# Patient Record
Sex: Female | Born: 1975 | Race: Black or African American | Hispanic: No | Marital: Single | Smoking: Current some day smoker
Health system: Southern US, Community
[De-identification: ages and names within clinical notes are randomized; demographics above are authoritative.]

## PROBLEM LIST (undated history)

## (undated) HISTORY — PX: TUBAL LIGATION: SHX77

---

## 2000-09-05 ENCOUNTER — Emergency Department (HOSPITAL_COMMUNITY): Admission: EM | Admit: 2000-09-05 | Discharge: 2000-09-05 | Payer: Self-pay | Admitting: Emergency Medicine

## 2010-09-24 ENCOUNTER — Emergency Department (HOSPITAL_BASED_OUTPATIENT_CLINIC_OR_DEPARTMENT_OTHER): Admission: EM | Admit: 2010-09-24 | Discharge: 2010-09-24 | Payer: Self-pay | Admitting: Emergency Medicine

## 2011-03-10 ENCOUNTER — Emergency Department (HOSPITAL_BASED_OUTPATIENT_CLINIC_OR_DEPARTMENT_OTHER)
Admission: EM | Admit: 2011-03-10 | Discharge: 2011-03-11 | Disposition: A | Discharge status: Home / Self Care | Payer: Medicaid Other | Attending: Emergency Medicine | Admitting: Emergency Medicine

## 2011-03-10 DIAGNOSIS — F17200 Nicotine dependence, unspecified, uncomplicated: Secondary | ICD-10-CM | POA: Insufficient documentation

## 2011-03-10 DIAGNOSIS — R1013 Epigastric pain: Secondary | ICD-10-CM | POA: Insufficient documentation

## 2011-03-10 DIAGNOSIS — D649 Anemia, unspecified: Secondary | ICD-10-CM | POA: Insufficient documentation

## 2011-03-10 LAB — URINALYSIS, ROUTINE W REFLEX MICROSCOPIC
Glucose, UA: NEGATIVE mg/dL
Ketones, ur: NEGATIVE mg/dL
Nitrite: NEGATIVE
Protein, ur: NEGATIVE mg/dL
Urobilinogen, UA: 0.2 mg/dL (ref 0.0–1.0)

## 2011-03-10 LAB — COMPREHENSIVE METABOLIC PANEL
AST: 17 U/L (ref 0–37)
BUN: 12 mg/dL (ref 6–23)
CO2: 24 mEq/L (ref 19–32)
Calcium: 9.3 mg/dL (ref 8.4–10.5)
Chloride: 110 mEq/L (ref 96–112)
Creatinine, Ser: 0.7 mg/dL (ref 0.4–1.2)
GFR calc Af Amer: >60 mL/min (ref >60)
GFR calc non Af Amer: >60 mL/min (ref >60)
Glucose, Bld: 101 mg/dL — ABNORMAL HIGH (ref 70–99)
Total Bilirubin: 0.4 mg/dL (ref 0.3–1.2)

## 2011-03-10 LAB — PREGNANCY, URINE: Preg Test, Ur: NEGATIVE

## 2011-03-10 LAB — DIFFERENTIAL
Basophils Absolute: 0 10*3/uL (ref 0.0–0.1)
Basophils Relative: 0 % (ref 0–1)
Lymphocytes Relative: 31 % (ref 12–46)
Monocytes Absolute: 0.7 10*3/uL (ref 0.1–1.0)
Neutro Abs: 4.3 10*3/uL (ref 1.7–7.7)

## 2011-03-10 LAB — CBC
HCT: 33.1 % — ABNORMAL LOW (ref 36.0–46.0)
Hemoglobin: 11 g/dL — ABNORMAL LOW (ref 12.0–15.0)
MCHC: 33.2 g/dL (ref 30.0–36.0)
RDW: 14 % (ref 11.5–15.5)
WBC: 7.6 10*3/uL (ref 4.0–10.5)

## 2011-03-10 LAB — URINE MICROSCOPIC-ADD ON

## 2011-03-10 LAB — LIPASE, BLOOD: Lipase: 83 U/L (ref 23–300)

## 2011-05-13 ENCOUNTER — Emergency Department (HOSPITAL_BASED_OUTPATIENT_CLINIC_OR_DEPARTMENT_OTHER)
Admission: EM | Admit: 2011-05-13 | Discharge: 2011-05-13 | Disposition: A | Discharge status: Home / Self Care | Payer: Medicaid Other | Attending: Emergency Medicine | Admitting: Emergency Medicine

## 2011-05-13 DIAGNOSIS — N390 Urinary tract infection, site not specified: Secondary | ICD-10-CM | POA: Insufficient documentation

## 2011-05-13 DIAGNOSIS — A5901 Trichomonal vulvovaginitis: Secondary | ICD-10-CM | POA: Insufficient documentation

## 2011-05-13 DIAGNOSIS — F17200 Nicotine dependence, unspecified, uncomplicated: Secondary | ICD-10-CM | POA: Insufficient documentation

## 2011-05-13 LAB — URINALYSIS, ROUTINE W REFLEX MICROSCOPIC
Glucose, UA: NEGATIVE mg/dL
Ketones, ur: NEGATIVE mg/dL
Nitrite: NEGATIVE
Protein, ur: NEGATIVE mg/dL
pH: 5 (ref 5.0–8.0)

## 2011-05-13 LAB — PREGNANCY, URINE: Preg Test, Ur: NEGATIVE

## 2011-05-13 LAB — WET PREP, GENITAL

## 2011-05-13 LAB — URINE MICROSCOPIC-ADD ON

## 2011-05-14 LAB — GC/CHLAMYDIA PROBE AMP, GENITAL: Chlamydia, DNA Probe: NEGATIVE

## 2012-04-06 ENCOUNTER — Encounter (HOSPITAL_BASED_OUTPATIENT_CLINIC_OR_DEPARTMENT_OTHER): Payer: Self-pay | Admitting: *Deleted

## 2012-04-06 ENCOUNTER — Emergency Department (HOSPITAL_BASED_OUTPATIENT_CLINIC_OR_DEPARTMENT_OTHER)
Admission: EM | Admit: 2012-04-06 | Discharge: 2012-04-06 | Disposition: A | Discharge status: Home / Self Care | Payer: Self-pay | Attending: Emergency Medicine | Admitting: Emergency Medicine

## 2012-04-06 ENCOUNTER — Emergency Department (INDEPENDENT_AMBULATORY_CARE_PROVIDER_SITE_OTHER): Payer: Self-pay

## 2012-04-06 DIAGNOSIS — F17200 Nicotine dependence, unspecified, uncomplicated: Secondary | ICD-10-CM | POA: Insufficient documentation

## 2012-04-06 DIAGNOSIS — K5900 Constipation, unspecified: Secondary | ICD-10-CM | POA: Insufficient documentation

## 2012-04-06 DIAGNOSIS — R109 Unspecified abdominal pain: Secondary | ICD-10-CM

## 2012-04-06 DIAGNOSIS — K644 Residual hemorrhoidal skin tags: Secondary | ICD-10-CM | POA: Insufficient documentation

## 2012-04-06 MED ORDER — HYDROCORTISONE 2.5 % RE CREA: TOPICAL_CREAM | RECTAL | Status: AC

## 2012-04-06 MED ORDER — HYDROMORPHONE HCL PF 1 MG/ML IJ SOLN
1.0000 mg | Freq: Once | INTRAMUSCULAR | Status: AC
  Administered 2012-04-06: 1 mg via INTRAMUSCULAR
  Filled 2012-04-06: qty 1

## 2012-04-06 MED ORDER — FLEET ENEMA 7-19 GM/118ML RE ENEM
1.0000 | ENEMA | Freq: Once | RECTAL | Status: AC
  Administered 2012-04-06: 1 via RECTAL
  Filled 2012-04-06: qty 1

## 2012-04-06 MED ORDER — SENNOSIDES-DOCUSATE SODIUM 8.6-50 MG PO TABS: 1.0000 | ORAL_TABLET | Freq: Every day | ORAL | Status: AC

## 2012-04-06 MED ORDER — HYDROCODONE-ACETAMINOPHEN 5-500 MG PO TABS: 1.0000 | ORAL_TABLET | Freq: Four times a day (QID) | ORAL | Status: AC | PRN

## 2012-04-06 NOTE — ED Notes (Signed)
Pt back in room.  Was not able to have BM.  Sts the pain is gone but she feels the irritation of having stool in her rectum that needs to come out and she cannot get it out.

## 2012-04-06 NOTE — ED Notes (Signed)
Pt reports having external hemorrhoids. Sts she has had problems with constipation before but not this bad.

## 2012-04-06 NOTE — ED Notes (Signed)
MD at bedside. 

## 2012-04-06 NOTE — Discharge Instructions (Signed)
Constipation in Adults Constipation is having fewer than 2 bowel movements per week. Usually, the stools are hard. As we grow older, constipation is more common. If you try to fix constipation with laxatives, the problem may get worse. This is because laxatives taken over a long period of time make the colon muscles weaker. A low-fiber diet, not taking in enough fluids, and taking some medicines may make these problems worse. MEDICATIONS THAT MAY CAUSE CONSTIPATION  Water pills (diuretics).   Calcium channel blockers (used to control blood pressure and for the heart).   Certain pain medicines (narcotics).   Anticholinergics.   Anti-inflammatory agents.   Antacids that contain aluminum.  DISEASES THAT CONTRIBUTE TO CONSTIPATION  Diabetes.   Parkinson's disease.   Dementia.   Stroke.   Depression.   Illnesses that cause problems with salt and water metabolism.  HOME CARE INSTRUCTIONS   Constipation is usually best cared for without medicines. Increasing dietary fiber and eating more fruits and vegetables is the best way to manage constipation.   Slowly increase fiber intake to 25 to 38 grams per day. Whole grains, fruits, vegetables, and legumes are good sources of fiber. A dietitian can further help you incorporate high-fiber foods into your diet.   Drink enough water and fluids to keep your urine clear or pale yellow.   A fiber supplement may be added to your diet if you cannot get enough fiber from foods.   Increasing your activities also helps improve regularity.   Suppositories, as suggested by your caregiver, will also help. If you are using antacids, such as aluminum or calcium containing products, it will be helpful to switch to products containing magnesium if your caregiver says it is okay.   If you have been given a liquid injection (enema) today, this is only a temporary measure. It should not be relied on for treatment of longstanding (chronic) constipation.    Stronger measures, such as magnesium sulfate, should be avoided if possible. This may cause uncontrollable diarrhea. Using magnesium sulfate may not allow you time to make it to the bathroom.  SEEK IMMEDIATE MEDICAL CARE IF:   There is bright red blood in the stool.   The constipation stays for more than 4 days.   There is belly (abdominal) or rectal pain.   You do not seem to be getting better.   You have any questions or concerns.  MAKE SURE YOU:   Understand these instructions.   Will watch your condition.   Will get help right away if you are not doing well or get worse.  Document Released: 08/29/2004 Document Revised: 11/20/2011 Document Reviewed: 11/04/2011 [facility] Patient Information 2012 [place], [place].Hemorrhoids Hemorrhoids are enlarged (dilated) veins around the rectum. There are 2 types of hemorrhoids, and the type of hemorrhoid is determined by its location. Internal hemorrhoids occur in the veins just inside the rectum.They are usually not painful, but they may bleed.However, they may poke through to the outside and become irritated and painful. External hemorrhoids involve the veins outside the anus and can be felt as a painful swelling or hard lump near the anus.They are often itchy and may crack and bleed. Sometimes clots will form in the veins. This makes them swollen and painful. These are called thrombosed hemorrhoids. CAUSES Causes of hemorrhoids include:  Pregnancy. This increases the pressure in the hemorrhoidal veins.   Constipation.   Straining to have a bowel movement.   Obesity.   Heavy lifting or other activity that caused you to  strain.  TREATMENT Most of the time hemorrhoids improve in 1 to 2 weeks. However, if symptoms do not seem to be getting better or if you have a lot of rectal bleeding, your caregiver may perform a procedure to help make the hemorrhoids get smaller or remove them completely.Possible treatments include:  Rubber band  ligation. A rubber band is placed at the base of the hemorrhoid to cut off the circulation.   Sclerotherapy. A chemical is injected to shrink the hemorrhoid.   Infrared light therapy. Tools are used to burn the hemorrhoid.   Hemorrhoidectomy. This is surgical removal of the hemorrhoid.  HOME CARE INSTRUCTIONS   Increase fiber in your diet. Ask your caregiver about using fiber supplements.   Drink enough water and fluids to keep your urine clear or pale yellow.   Exercise regularly.   Go to the bathroom when you have the urge to have a bowel movement. Do not wait.   Avoid straining to have bowel movements.   Keep the anal area dry and clean.   Only take over-the-counter or prescription medicines for pain, discomfort, or fever as directed by your caregiver.  If your hemorrhoids are thrombosed:  Take warm sitz baths for 20 to 30 minutes, 3 to 4 times per day.   If the hemorrhoids are very tender and swollen, place ice packs on the area as tolerated. Using ice packs between sitz baths may be helpful. Fill a plastic bag with ice. Place a towel between the bag of ice and your skin.   Medicated creams and suppositories may be used or applied as directed.   Do not use a donut-shaped pillow or sit on the toilet for long periods. This increases blood pooling and pain.  SEEK MEDICAL CARE IF:   You have increasing pain and swelling that is not controlled with your medicine.   You have uncontrolled bleeding.   You have difficulty or you are unable to have a bowel movement.   You have pain or inflammation outside the area of the hemorrhoids.   You have chills or an oral temperature above 102 F (38.9 C).  MAKE SURE YOU:   Understand these instructions.   Will watch your condition.   Will get help right away if you are not doing well or get worse.  Document Released: 11/28/2000 Document Revised: 11/20/2011 Document Reviewed: 04/04/2008 [facility] Patient Information 2012 [place],  [place].Hemorrhoids Hemorrhoids are enlarged (dilated) veins around the rectum. There are 2 types of hemorrhoids, and the type of hemorrhoid is determined by its location. Internal hemorrhoids occur in the veins just inside the rectum.They are usually not painful, but they may bleed.However, they may poke through to the outside and become irritated and painful. External hemorrhoids involve the veins outside the anus and can be felt as a painful swelling or hard lump near the anus.They are often itchy and may crack and bleed. Sometimes clots will form in the veins. This makes them swollen and painful. These are called thrombosed hemorrhoids. CAUSES Causes of hemorrhoids include:  Pregnancy. This increases the pressure in the hemorrhoidal veins.   Constipation.   Straining to have a bowel movement.   Obesity.   Heavy lifting or other activity that caused you to strain.  TREATMENT Most of the time hemorrhoids improve in 1 to 2 weeks. However, if symptoms do not seem to be getting better or if you have a lot of rectal bleeding, your caregiver may perform a procedure to help make the hemorrhoids get  smaller or remove them completely.Possible treatments include:  Rubber band ligation. A rubber band is placed at the base of the hemorrhoid to cut off the circulation.   Sclerotherapy. A chemical is injected to shrink the hemorrhoid.   Infrared light therapy. Tools are used to burn the hemorrhoid.   Hemorrhoidectomy. This is surgical removal of the hemorrhoid.  HOME CARE INSTRUCTIONS   Increase fiber in your diet. Ask your caregiver about using fiber supplements.   Drink enough water and fluids to keep your urine clear or pale yellow.   Exercise regularly.   Go to the bathroom when you have the urge to have a bowel movement. Do not wait.   Avoid straining to have bowel movements.   Keep the anal area dry and clean.   Only take over-the-counter or prescription medicines for pain,  discomfort, or fever as directed by your caregiver.  If your hemorrhoids are thrombosed:  Take warm sitz baths for 20 to 30 minutes, 3 to 4 times per day.   If the hemorrhoids are very tender and swollen, place ice packs on the area as tolerated. Using ice packs between sitz baths may be helpful. Fill a plastic bag with ice. Place a towel between the bag of ice and your skin.   Medicated creams and suppositories may be used or applied as directed.   Do not use a donut-shaped pillow or sit on the toilet for long periods. This increases blood pooling and pain.  SEEK MEDICAL CARE IF:   You have increasing pain and swelling that is not controlled with your medicine.   You have uncontrolled bleeding.   You have difficulty or you are unable to have a bowel movement.   You have pain or inflammation outside the area of the hemorrhoids.   You have chills or an oral temperature above 102 F (38.9 C).  MAKE SURE YOU:   Understand these instructions.   Will watch your condition.   Will get help right away if you are not doing well or get worse.  Document Released: 11/28/2000 Document Revised: 11/20/2011 Document Reviewed: 04/04/2008 [facility] Patient Information 2012 [place], [place].

## 2012-04-06 NOTE — ED Notes (Signed)
Patient transported to X-ray 

## 2012-04-06 NOTE — ED Notes (Signed)
Pt c/o hemorrhoids pain x1 day

## 2012-04-07 NOTE — ED Provider Notes (Signed)
History     CSN: [id number]  Arrival date & time 04/06/12  1604   First MD Initiated Contact with Patient 04/06/12 1616      Chief Complaint  Patient presents with  . Hemorrhoids    (Consider location/radiation/quality/duration/timing/severity/associated sxs/prior treatment) HPI Patient is a 36 yo female with history of hemorrhoids who presents complaining of 10/10 rectal pain with 2 days of constipation.  She denies abdominal pain, nausea, vomiting, fevers, or history of obstruction.  She denies urinary symptoms but is in the midst of an HSV outbreak.  She denies any history of anorectal involvement and does not feel that her pain is similar to that.  At home patient tried enema as well as manual disimpaction without relief.  She has noted some blood but no significant rectal bleeding. There are no other associated or modifying factors.  History reviewed. No pertinent past medical history.  Past Surgical History  Procedure Date  . Tubal ligation     History reviewed. No pertinent family history.  History  Substance Use Topics  . Smoking status: Current Some Day Smoker  . Smokeless tobacco: Not on file  . Alcohol Use: No    OB History    Grav Para Term Preterm Abortions TAB SAB Ect Mult Living                  Review of Systems  Constitutional: Negative.   HENT: Negative.   Eyes: Negative.   Respiratory: Negative.   Cardiovascular: Negative.   Gastrointestinal: Positive for constipation, anal bleeding and rectal pain.  Genitourinary: Negative.   Musculoskeletal: Negative.   Skin: Negative.   Neurological: Negative.   Hematological: Negative.   Psychiatric/Behavioral: Negative.   All other systems reviewed and are negative.    Allergies  Review of patient's allergies indicates no known allergies.  Home Medications   Current Outpatient Rx  Name Route Sig Dispense Refill  . HYDROCODONE-ACETAMINOPHEN 5-500 MG PO TABS Oral Take 1-2 tablets by mouth every 6  (six) hours as needed for pain. 15 tablet 0  . HYDROCORTISONE 2.5 % RE CREA  Apply rectally 2 times daily 30 g 1  . SENNOSIDES-DOCUSATE SODIUM 8.6-50 MG PO TABS Oral Take 1 tablet by mouth daily. 30 tablet 0    BP 129/89  Pulse 87  Temp(Src) 98.2 F (36.8 C) (Oral)  Resp 16  Ht 5\' 4"  (1.626 m)  Wt 188 lb (85.276 kg)  BMI 32.27 kg/m2  SpO2 100%  LMP 04/04/2012  Physical Exam  Nursing note and vitals reviewed. GEN: Well-developed, well-nourished female in no distress, tearful and very uncomfortable HEENT: Atraumatic, normocephalic. Oropharynx clear without erythema EYES: PERRLA BL, no scleral icterus. NECK: Trachea midline, no meningismus CV: regular rate and rhythm. No murmurs, rubs, or gallops PULM: No respiratory distress.  No crackles, wheezes, or rales. GI: soft, non-tender. No guarding, rebound, or tenderness. + bowel sounds.  Rectal with 2 small external hemorrhoids, small internal hemorrhoids appreciated with no obvious abscesses or fluctuance felt, no gross blood on the glove, large amount of moderately soft stool in the rectal vault  GU: deferred Neuro: cranial nerves 2-12 intact, no abnormalities of strength or sensation, A and O x 3 MSK: Patient moves all 4 extremities symmetrically, no deformity, edema, or injury noted Skin: No rashes petechiae, purpura, or jaundice Psych: no abnormality of mood   ED Course  Procedures (including critical care time)  Labs Reviewed - No data to display Dg Abd Acute W/chest  04/06/2012  *RADIOLOGY  REPORT*  Clinical Data: Abdominal pain.  Constipation.  ACUTE ABDOMEN SERIES (ABDOMEN 2 VIEW & CHEST 1 VIEW)  Comparison:  None.  Findings:  There is no evidence of dilated bowel loops or free intraperitoneal air.  No radiopaque calculi or other significant radiographic abnormality is seen. Heart size and mediastinal contours are within normal limits.  Both lungs are clear.  IMPRESSION: Negative abdominal radiographs.  No acute cardiopulmonary  disease.  Original Report Authenticated By: Danae Orleans, M.D.     1. External hemorrhoids   2. Constipation       MDM  Patient was evaluated and given her pain one IM dose of pain medication was given and patient tried to defecate.  She was unable to do so and plain film was performed to rule out obstruction.  This was negative.  Patient agreed to enema here and was able to have a BM with near complete resolution of symptoms.  Patient was discharged with stool softener and anusol prescription.  She was given the number of GI for follow-up if needed in the future.  She was given 10 tabs of po pain medicine and cautioned about associated constipation.  Patient was discharged in good condition.        Cyndra Numbers, MD 04/07/12 [phone]

## 2012-05-31 IMAGING — CR DG ABDOMEN ACUTE W/ 1V CHEST
3 series · 3 of 3 positions shown · non-contrast
Comparison: None.

CLINICAL DATA: Abdominal pain.  Constipation.

ACUTE ABDOMEN SERIES (ABDOMEN 2 VIEW & CHEST 1 VIEW)

[w chest pa]
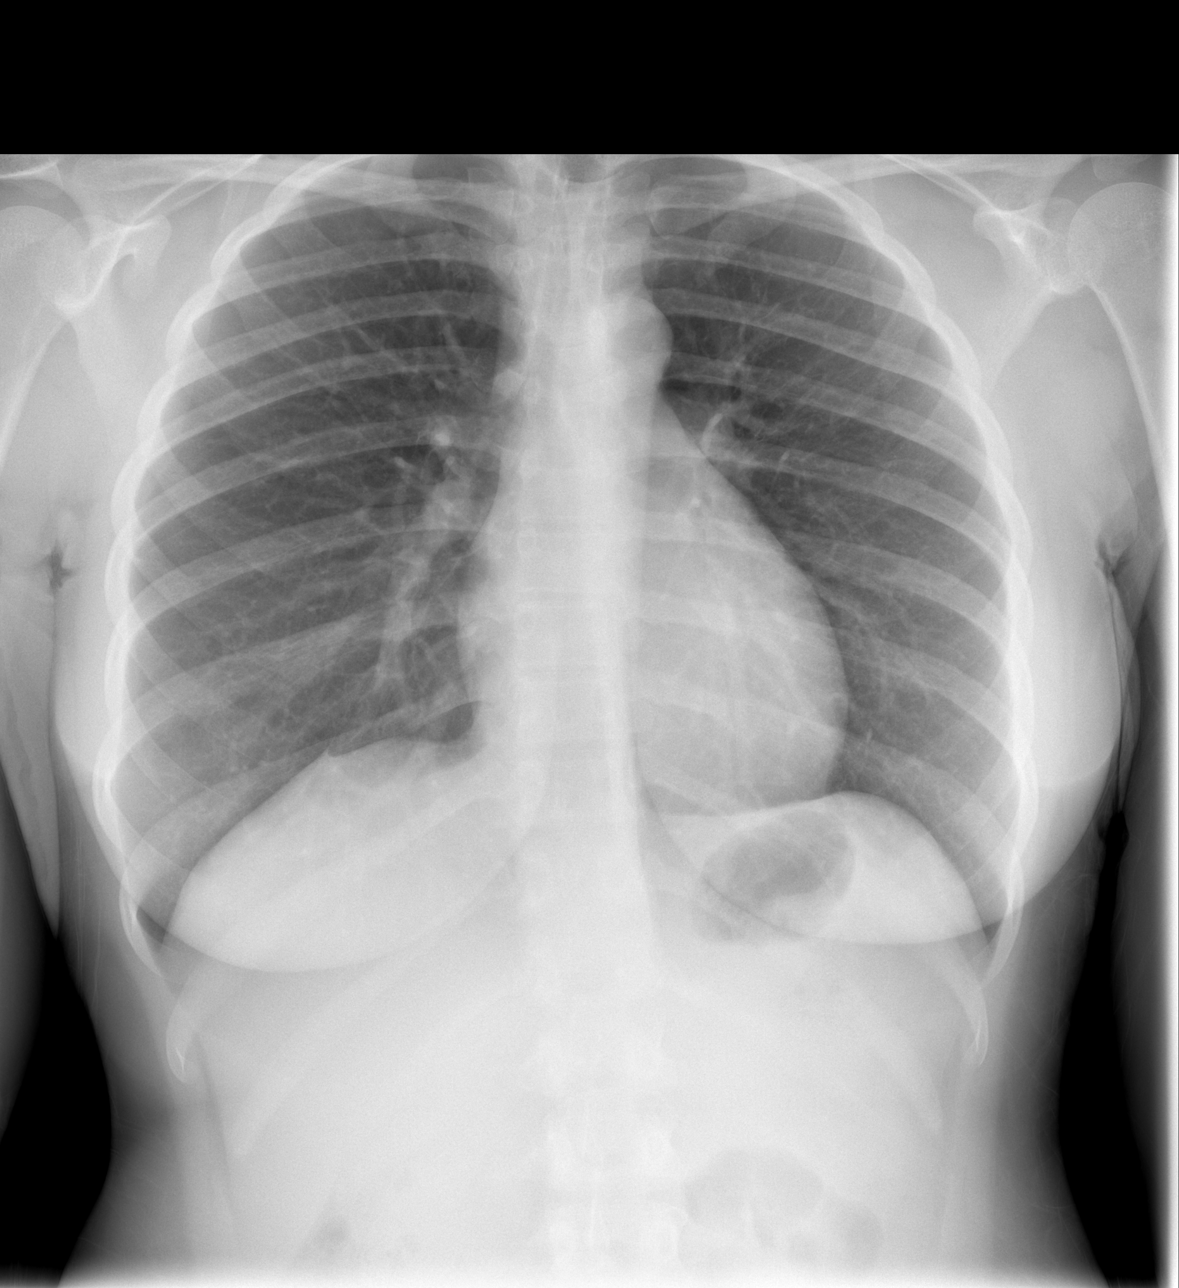

[w abdomen upright]
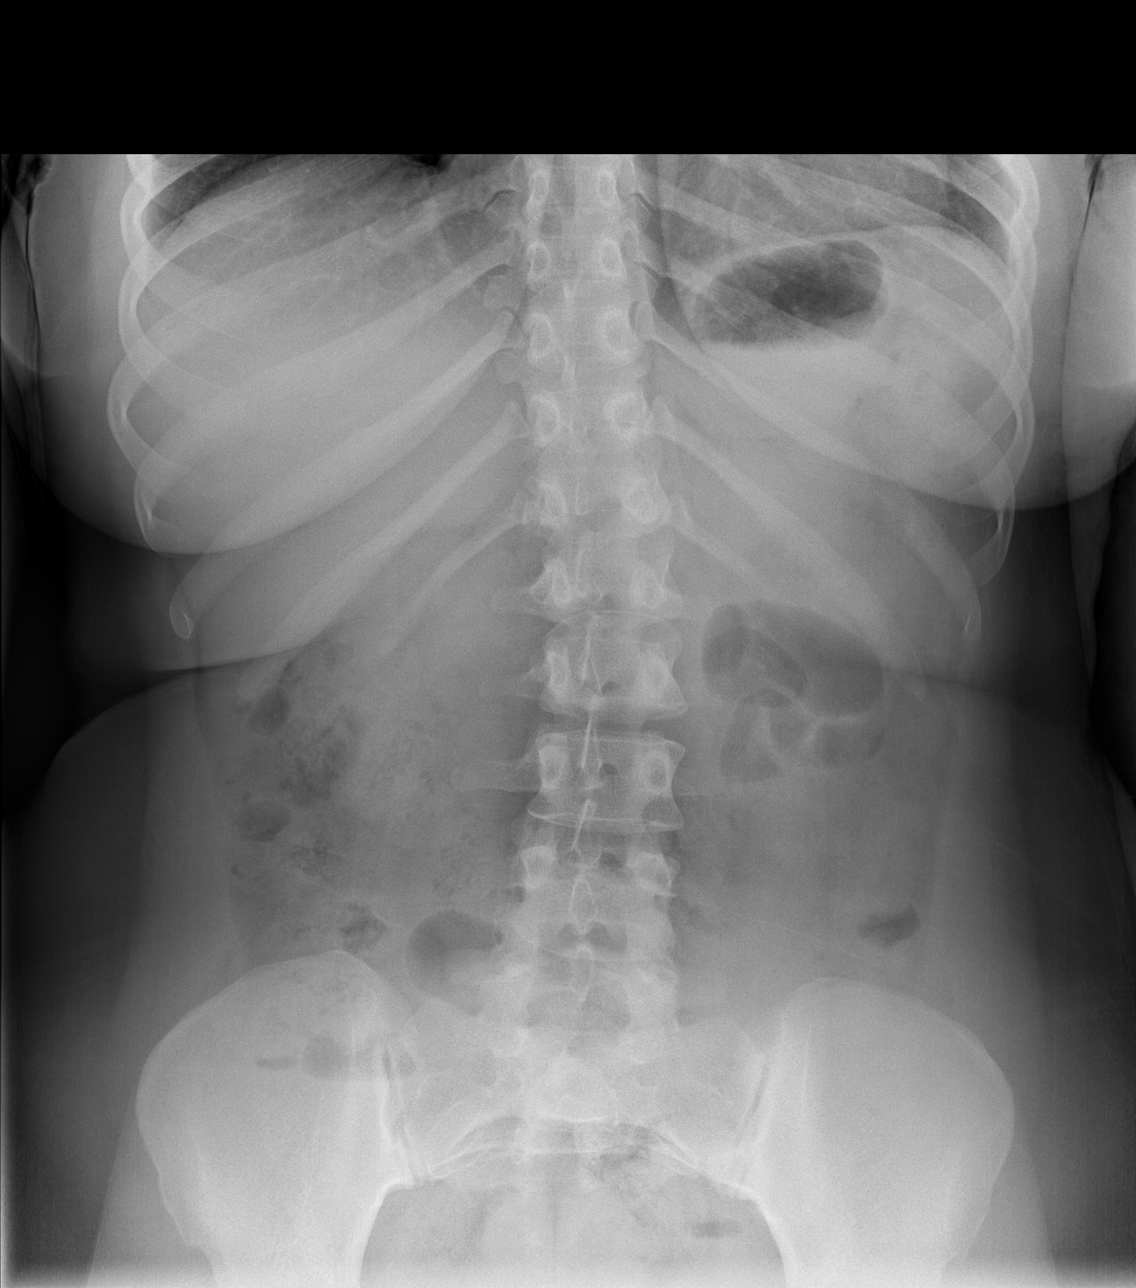

[t abdomen supine]
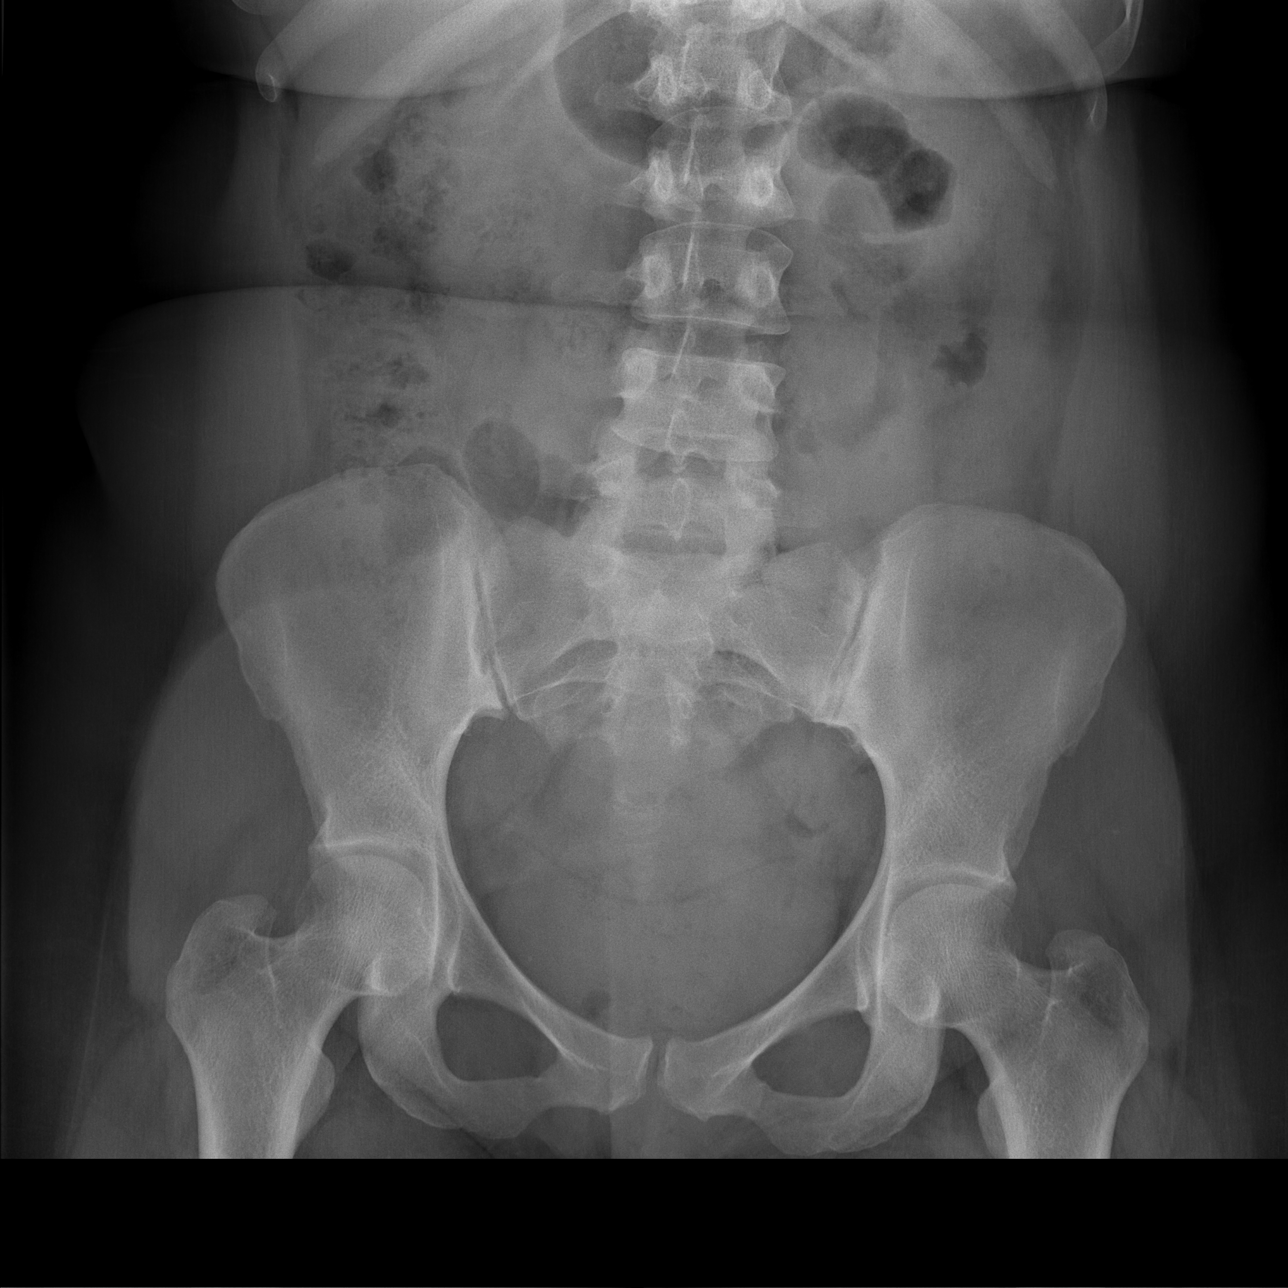

[3 of 3 positions shown; findings below may reference images not displayed]

FINDINGS: There is no evidence of dilated bowel loops or free
intraperitoneal air.  No radiopaque calculi or other significant
radiographic abnormality is seen. Heart size and mediastinal
contours are within normal limits.  Both lungs are clear.
IMPRESSION: Negative abdominal radiographs.  No acute cardiopulmonary disease.

## 2013-07-07 ENCOUNTER — Encounter (HOSPITAL_BASED_OUTPATIENT_CLINIC_OR_DEPARTMENT_OTHER): Payer: Self-pay | Admitting: *Deleted

## 2013-07-07 ENCOUNTER — Emergency Department (HOSPITAL_BASED_OUTPATIENT_CLINIC_OR_DEPARTMENT_OTHER)
Admission: EM | Admit: 2013-07-07 | Discharge: 2013-07-07 | Disposition: A | Discharge status: Home / Self Care | Payer: Self-pay | Attending: Emergency Medicine | Admitting: Emergency Medicine

## 2013-07-07 DIAGNOSIS — R350 Frequency of micturition: Secondary | ICD-10-CM | POA: Insufficient documentation

## 2013-07-07 DIAGNOSIS — F17200 Nicotine dependence, unspecified, uncomplicated: Secondary | ICD-10-CM | POA: Insufficient documentation

## 2013-07-07 DIAGNOSIS — N390 Urinary tract infection, site not specified: Secondary | ICD-10-CM | POA: Insufficient documentation

## 2013-07-07 LAB — URINALYSIS, ROUTINE W REFLEX MICROSCOPIC
Bilirubin Urine: NEGATIVE
Glucose, UA: NEGATIVE mg/dL
Hgb urine dipstick: NEGATIVE
Ketones, ur: NEGATIVE mg/dL
Nitrite: NEGATIVE
Protein, ur: NEGATIVE mg/dL
Specific Gravity, Urine: 1.023 (ref 1.005–1.030)
Urobilinogen, UA: 0.2 mg/dL (ref 0.0–1.0)
pH: 7 (ref 5.0–8.0)

## 2013-07-07 LAB — URINE MICROSCOPIC-ADD ON

## 2013-07-07 MED ORDER — SULFAMETHOXAZOLE-TMP DS 800-160 MG PO TABS
1.0000 | ORAL_TABLET | Freq: Once | ORAL | Status: AC
  Administered 2013-07-07: 1 via ORAL
  Filled 2013-07-07: qty 1

## 2013-07-07 MED ORDER — PHENAZOPYRIDINE HCL 200 MG PO TABS: 200.0000 mg | ORAL_TABLET | Freq: Three times a day (TID) | ORAL | Status: DC

## 2013-07-07 MED ORDER — SULFAMETHOXAZOLE-TRIMETHOPRIM 800-160 MG PO TABS: 1.0000 | ORAL_TABLET | Freq: Two times a day (BID) | ORAL | Status: AC

## 2013-07-07 NOTE — ED Provider Notes (Signed)
   History    CSN: [id number] Arrival date & time 07/07/13  6295  First MD Initiated Contact with Patient 07/07/13 [phone]     Chief Complaint  Patient presents with  . Dysuria  . Urinary Frequency   (Consider location/radiation/quality/duration/timing/severity/associated sxs/prior Treatment) HPI Patient presents with ongoing dysuria. Symptoms began approximately 3 weeks ago. Symptoms have been persistent in spite of using OTC medication, not antibiotics. There is no report of new fever, chills, nausea, vomiting, diarrhea. Last menstrual cycle was one week ago. Symptoms are described as pressure-like discomfort both during and after urination. No report of new hematuria Patient also complains of occasional bilateral knee pain.  History reviewed. No pertinent past medical history. Past Surgical History  Procedure Laterality Date  . Tubal ligation     History reviewed. No pertinent family history. History  Substance Use Topics  . Smoking status: Current Some Day Smoker  . Smokeless tobacco: Not on file  . Alcohol Use: No   OB History   Grav Para Term Preterm Abortions TAB SAB Ect Mult Living                 Review of Systems  All other systems reviewed and are negative.    Allergies  Review of patient's allergies indicates no known allergies.  Home Medications  No current outpatient prescriptions on file. BP 116/69  Pulse 74  Temp(Src) 99 F (37.2 C) (Oral)  Resp 18  Ht 5\' 4"  (1.626 m)  Wt 174 lb (78.926 kg)  BMI 29.85 kg/m2  SpO2 100%  LMP 06/28/2013 Physical Exam  Nursing note and vitals reviewed. Constitutional: She is oriented to person, place, and time. She appears well-developed and well-nourished. No distress.  HENT:  Head: Normocephalic and atraumatic.  Eyes: Conjunctivae and EOM are normal.  Cardiovascular: Normal rate and regular rhythm.   Pulmonary/Chest: Effort normal and breath sounds normal. No stridor. No respiratory distress.  Abdominal:  She exhibits no distension. There is no hepatosplenomegaly. There is tenderness in the suprapubic area. There is no rigidity, no rebound, no guarding and no CVA tenderness.  Musculoskeletal: She exhibits no edema.  Neurological: She is alert and oriented to person, place, and time. No cranial nerve deficit.  Skin: Skin is warm and dry.  Psychiatric: She has a normal mood and affect.    ED Course  Procedures (including critical care time) Labs Reviewed  URINALYSIS, ROUTINE W REFLEX MICROSCOPIC   No results found. No diagnosis found.  MDM  This well-appearing afebrile female presents with ongoing urinary complaints. On exam she is awake and alert in no distress.  There is mild tenderness to palpation in the suprapubic region, but no peritonitis or guarding. Patient's urinalysis demonstrates mild abnormalities.  With her description of symptoms, antibiotics were provided with urine culture sent.   Gerhard Munch, MD 07/07/13 [phone]

## 2013-07-07 NOTE — ED Notes (Signed)
Patient asked to provide clean catch urine sample. 

## 2013-07-07 NOTE — ED Notes (Signed)
Pt amb to room 10 with quick steady gait in nad. Pt reports dysuria "I've been feeling some kinda way down there after I pee..." frequency, urgency. Pt states she has been taking azo with relief, but now the azo is not helping. md at bedside for exam.

## 2013-07-08 LAB — URINE CULTURE: Colony Count: >=100000

## 2014-01-13 ENCOUNTER — Emergency Department (HOSPITAL_BASED_OUTPATIENT_CLINIC_OR_DEPARTMENT_OTHER)
Admission: EM | Admit: 2014-01-13 | Discharge: 2014-01-13 | Disposition: A | Discharge status: Home / Self Care | Payer: Medicaid Other | Attending: Emergency Medicine | Admitting: Emergency Medicine

## 2014-01-13 ENCOUNTER — Encounter (HOSPITAL_BASED_OUTPATIENT_CLINIC_OR_DEPARTMENT_OTHER): Payer: Self-pay | Admitting: Emergency Medicine

## 2014-01-13 DIAGNOSIS — Z9851 Tubal ligation status: Secondary | ICD-10-CM | POA: Insufficient documentation

## 2014-01-13 DIAGNOSIS — F17200 Nicotine dependence, unspecified, uncomplicated: Secondary | ICD-10-CM | POA: Insufficient documentation

## 2014-01-13 DIAGNOSIS — R102 Pelvic and perineal pain: Secondary | ICD-10-CM

## 2014-01-13 DIAGNOSIS — Z3202 Encounter for pregnancy test, result negative: Secondary | ICD-10-CM | POA: Insufficient documentation

## 2014-01-13 DIAGNOSIS — N83209 Unspecified ovarian cyst, unspecified side: Secondary | ICD-10-CM | POA: Insufficient documentation

## 2014-01-13 DIAGNOSIS — Z79899 Other long term (current) drug therapy: Secondary | ICD-10-CM | POA: Insufficient documentation

## 2014-01-13 LAB — URINALYSIS, ROUTINE W REFLEX MICROSCOPIC
BILIRUBIN URINE: NEGATIVE
Glucose, UA: NEGATIVE mg/dL
HGB URINE DIPSTICK: NEGATIVE
Ketones, ur: 15 mg/dL — AB
Leukocytes, UA: NEGATIVE
NITRITE: NEGATIVE
PH: 6 (ref 5.0–8.0)
Protein, ur: NEGATIVE mg/dL
SPECIFIC GRAVITY, URINE: 1.025 (ref 1.005–1.030)
Urobilinogen, UA: 0.2 mg/dL (ref 0.0–1.0)

## 2014-01-13 LAB — PREGNANCY, URINE: PREG TEST UR: NEGATIVE

## 2014-01-13 LAB — WET PREP, GENITAL
Trich, Wet Prep: NONE SEEN
YEAST WET PREP: NONE SEEN

## 2014-01-13 MED ORDER — ONDANSETRON 4 MG PO TBDP: 4.0000 mg | ORAL_TABLET | Freq: Three times a day (TID) | ORAL | Status: DC | PRN

## 2014-01-13 MED ORDER — HYDROCODONE-ACETAMINOPHEN 5-325 MG PO TABS
1.0000 | ORAL_TABLET | Freq: Once | ORAL | Status: AC
  Administered 2014-01-13: 1 via ORAL
  Filled 2014-01-13: qty 1

## 2014-01-13 MED ORDER — NAPROXEN 500 MG PO TABS: 500.0000 mg | ORAL_TABLET | Freq: Two times a day (BID) | ORAL | Status: DC

## 2014-01-13 MED ORDER — ONDANSETRON 4 MG PO TBDP
4.0000 mg | ORAL_TABLET | Freq: Once | ORAL | Status: AC
  Administered 2014-01-13: 4 mg via ORAL
  Filled 2014-01-13: qty 1

## 2014-01-13 MED ORDER — HYDROCODONE-ACETAMINOPHEN 5-325 MG PO TABS: 2.0000 | ORAL_TABLET | ORAL | Status: DC | PRN

## 2014-01-13 NOTE — Discharge Instructions (Signed)
Ovarian Cyst An ovarian cyst is a sac filled with fluid or blood. This sac is attached to the ovary. Some cysts go away on their own. Other cysts need treatment.  HOME CARE   Only take medicine as told by your doctor.  Follow up with your doctor as told.  Get regular pelvic exams and Pap tests. GET HELP IF:  Your periods are late, not regular, or painful.  You stop having periods.  Your belly (abdominal) or pelvic pain does not go away.  Your belly becomes large or puffy (swollen).  You have a hard time peeing (totally emptying your bladder).  You have pressure on your bladder.  You have pain during sex.  You feel fullness, pressure, or discomfort in your belly.  You lose weight for no reason.  You feel sick most of the time.  You have a hard time pooping (constipation).  You do not feel like eating.  You develop pimples (acne).  You have an increase in hair on your body and face.  You are gaining weight for no reason.  You think you are pregnant. GET HELP RIGHT AWAY IF:   Your belly pain gets worse.  You feel sick to your stomach (nauseous), and you throw up (vomit).  You have a fever that comes on fast.  You have belly pain while pooping (bowel movement).  Your periods are heavier than usual. MAKE SURE YOU:   Understand these instructions.  Will watch your condition.  Will get help right away if you are not doing well or get worse. Document Released: 05/19/2008 Document Revised: 09/21/2013 Document Reviewed: 08/08/2013 [facility] Patient Information 2014 [place].  Pelvic Pain, Female Female pelvic pain can be caused by many different things and start from a variety of places. Pelvic pain refers to pain that is located in the lower half of the abdomen and between your hips. The pain may occur over a short period of time (acute) or may be reoccurring (chronic). The cause of pelvic pain may be related to disorders affecting the female reproductive  organs (gynecologic), but it may also be related to the bladder, kidney stones, an intestinal complication, or muscle or skeletal problems. Getting help right away for pelvic pain is important, especially if there has been severe, sharp, or a sudden onset of unusual pain. It is also important to get help right away because some types of pelvic pain can be life threatening.  CAUSES  Below are only some of the causes of pelvic pain. The causes of pelvic pain can be in one of several categories.   Gynecologic.  Pelvic inflammatory disease.  Sexually transmitted infection.  Ovarian cyst or a twisted ovarian ligament (ovarian torsion).  Uterine lining that grows outside the uterus (endometriosis).  Fibroids, cysts, or tumors.  Ovulation.  Pregnancy.  Pregnancy that occurs outside the uterus (ectopic pregnancy).  Miscarriage.  Labor.  Abruption of the placenta or ruptured uterus.  Infection.  Uterine infection (endometritis).  Bladder infection.  Diverticulitis.  Miscarriage related to a uterine infection (septic abortion).  Bladder.  Inflammation of the bladder (cystitis).  Kidney stone(s).  Gastrointenstinal.  Constipation.  Diverticulitis.  Neurologic.  Trauma.  Feeling pelvic pain because of mental or emotional causes (psychosomatic).  Cancers of the bowel or pelvis. EVALUATION  Your caregiver will want to take a careful history of your concerns. This includes recent changes in your health, a careful gynecologic history of your periods (menses), and a sexual history. Obtaining your family history and  medical history is also important. Your caregiver may suggest a pelvic exam. A pelvic exam will help identify the location and severity of the pain. It also helps in the evaluation of which organ system may be involved. In order to identify the cause of the pelvic pain and be properly treated, your caregiver may order tests. These tests may include:   A pregnancy  test.  Pelvic ultrasonography.  An X-ray exam of the abdomen.  A urinalysis or evaluation of vaginal discharge.  Blood tests. HOME CARE INSTRUCTIONS   Only take over-the-counter or prescription medicines for pain, discomfort, or fever as directed by your caregiver.   Rest as directed by your caregiver.   Eat a balanced diet.   Drink enough fluids to make your urine clear or pale yellow, or as directed.   Avoid sexual intercourse if it causes pain.   Apply warm or cold compresses to the lower abdomen depending on which one helps the pain.   Avoid stressful situations.   Keep a journal of your pelvic pain. Write down when it started, where the pain is located, and if there are things that seem to be associated with the pain, such as food or your menstrual cycle.  Follow up with your caregiver as directed.  SEEK MEDICAL CARE IF:  Your medicine does not help your pain.  You have abnormal vaginal discharge. SEEK IMMEDIATE MEDICAL CARE IF:   You have heavy bleeding from the vagina.   Your pelvic pain increases.   You feel lightheaded or faint.   You have chills.   You have pain with urination or blood in your urine.   You have uncontrolled diarrhea or vomiting.   You have a fever or persistent symptoms for more than 3 days.  You have a fever and your symptoms suddenly get worse.   You are being physically or sexually abused.  MAKE SURE YOU:  Understand these instructions.  Will watch your condition.  Will get help if you are not doing well or get worse. Document Released: 10/28/2004 Document Revised: 06/01/2012 Document Reviewed: 03/22/2012 [facility] Patient Information 2014 [place], [place].

## 2014-01-13 NOTE — ED Notes (Signed)
Lower abdominal pain. Took AZO last week for dysuria. Vaginal discharge.

## 2014-01-13 NOTE — ED Provider Notes (Signed)
CSN: [id number]     Arrival date & time 01/13/14  1841 History  This chart was scribed for Rolland PorterMark Felicity Penix, MD by Blanchard KelchNicole Curnes, ED Scribe. The patient was seen in room MH04/MH04. Patient's care was started at 7:11 PM.     Chief Complaint  Patient presents with  . Abdominal Pain    Patient is a 38 y.o. female presenting with abdominal pain. The history is provided by the patient. No language interpreter was used.  Abdominal Pain Associated symptoms: vaginal discharge   Associated symptoms: no chest pain, no chills, no cough, no diarrhea, no dysuria, no fatigue, no fever, no hematuria, no nausea, no shortness of breath, no sore throat, no vaginal bleeding and no vomiting     HPI Comments: Cassandra Summers is a 38 y.o. female who presents to the Emergency Department complaining of constant lower abdominal pain that began last night. She describes the pain as a swollen sensation like her "ovaries are swollen." She reports associated vaginal discharge for three weeks, described as thin, watery and with a "funky" odor. She states that she had vaginal pain today while having intercourse with her partner, but denies any abnormal bleeding before, during or after. She also reports dysuria and frequency last week, but states that the dysuria subsided after taking Azo. She states that she has a past history of an STIs, last in 2012 and has a new partner since then that she currently lives with. Her LNMP was mid January. She states they are usually normal. She denies nausea, vomiting, diarrhea, constipation, fever, chest pain, shortness of breath, rash or arthralgias. She reports history of tubal ligation.  History reviewed. No pertinent past medical history. Past Surgical History  Procedure Laterality Date  . Tubal ligation     No family history on file. History  Substance Use Topics  . Smoking status: Current Some Day Smoker  . Smokeless tobacco: Not on file  . Alcohol Use: No   OB History   Grav Para  Term Preterm Abortions TAB SAB Ect Mult Living                 Review of Systems  Constitutional: Negative for fever, chills, diaphoresis, appetite change and fatigue.  HENT: Negative for mouth sores, sore throat and trouble swallowing.   Eyes: Negative for visual disturbance.  Respiratory: Negative for cough, chest tightness, shortness of breath and wheezing.   Cardiovascular: Negative for chest pain.  Gastrointestinal: Positive for abdominal pain. Negative for nausea, vomiting, diarrhea and abdominal distention.  Endocrine: Negative for polydipsia, polyphagia and polyuria.  Genitourinary: Positive for frequency, vaginal discharge and vaginal pain. Negative for dysuria, hematuria and vaginal bleeding.  Musculoskeletal: Negative for gait problem.  Skin: Negative for color change, pallor and rash.  Neurological: Negative for dizziness, syncope, light-headedness and headaches.  Hematological: Does not bruise/bleed easily.  Psychiatric/Behavioral: Negative for behavioral problems and confusion.    Allergies  Review of patient's allergies indicates no known allergies.  Home Medications   Current Outpatient Rx  Name  Route  Sig  Dispense  Refill  . HYDROcodone-acetaminophen (NORCO/VICODIN) 5-325 MG per tablet   Oral   Take 2 tablets by mouth every 4 (four) hours as needed.   10 tablet   0   . naproxen (NAPROSYN) 500 MG tablet   Oral   Take 1 tablet (500 mg total) by mouth 2 (two) times daily.   30 tablet   0   . ondansetron (ZOFRAN ODT) 4 MG disintegrating tablet  Oral   Take 1 tablet (4 mg total) by mouth every 8 (eight) hours as needed for nausea.   20 tablet   0   . phenazopyridine (PYRIDIUM) 200 MG tablet   Oral   Take 1 tablet (200 mg total) by mouth 3 (three) times daily.   6 tablet   0    Triage Vitals: BP 121/81  Pulse 70  Temp(Src) 98.8 F (37.1 C) (Oral)  Resp 20  Ht 5\' 4"  (1.626 m)  Wt 174 lb (78.926 kg)  BMI 29.85 kg/m2  SpO2 100%  LMP  12/30/2013  Physical Exam  Constitutional: She is oriented to person, place, and time. She appears well-developed and well-nourished. No distress.  HENT:  Head: Normocephalic.  Eyes: Conjunctivae are normal. Pupils are equal, round, and reactive to light. No scleral icterus.  Neck: Normal range of motion. Neck supple. No thyromegaly present.  Cardiovascular: Normal rate and regular rhythm.  Exam reveals no gallop and no friction rub.   No murmur heard. Pulmonary/Chest: Effort normal and breath sounds normal. No respiratory distress. She has no wheezes. She has no rales.  Abdominal: Soft. Bowel sounds are normal. She exhibits no distension. There is tenderness in the right lower quadrant and left lower quadrant. There is no rebound.  Mild tenderness to bilateral lower quadrants.   Musculoskeletal: Normal range of motion.  Neurological: She is alert and oriented to person, place, and time.  Skin: Skin is warm and dry. No rash noted.  Psychiatric: She has a normal mood and affect. Her behavior is normal.    ED Course  Procedures (including critical care time)  DIAGNOSTIC STUDIES: Oxygen Saturation is 100% on room air, normal by my interpretation.    COORDINATION OF CARE: 7:15 PM -Will perform pelvic exam. Patient verbalizes understanding and agrees with treatment plan.    Labs Review Labs Reviewed  WET PREP, GENITAL - Abnormal; Notable for the following:    Clue Cells Wet Prep HPF POC FEW (*)    WBC, Wet Prep HPF POC FEW (*)    All other components within normal limits  URINALYSIS, ROUTINE W REFLEX MICROSCOPIC - Abnormal; Notable for the following:    Ketones, ur 15 (*)    All other components within normal limits  GC/CHLAMYDIA PROBE AMP  PREGNANCY, URINE   Imaging Review No results found.  EKG Interpretation   None       MDM   1. Pelvic pain   2. Ovarian cyst    Pelvic exam shows normal-appearing pink cervix. She has no cervical motion tenderness she has  bilateral adnexal tenderness. She is not pregnant. Only a few white blood cells, and clue cells.  Her pain is mid cycle. Left greater than right. Not pregnant. No obvious infection. I think she is fine for outpatient treatment simple anti-inflammatories diagnosis pelvic pain possible ovarian cyst. GYN followup at her next menses recheck fever. Heavy bleeding, other symptoms.  I personally performed the services described in this documentation, which was scribed in my presence. The recorded information has been reviewed and is accurate.    Rolland Porter, MD 01/13/14 2053

## 2014-01-14 LAB — GC/CHLAMYDIA PROBE AMP
CT PROBE, AMP APTIMA: NEGATIVE
GC Probe RNA: NEGATIVE

## 2014-02-27 ENCOUNTER — Encounter (HOSPITAL_BASED_OUTPATIENT_CLINIC_OR_DEPARTMENT_OTHER): Payer: Self-pay | Admitting: Emergency Medicine

## 2014-02-27 ENCOUNTER — Observation Stay (HOSPITAL_BASED_OUTPATIENT_CLINIC_OR_DEPARTMENT_OTHER)
Admission: EM | Admit: 2014-02-27 | Discharge: 2014-03-01 | Disposition: A | Discharge status: Home / Self Care | Payer: Self-pay | Attending: Surgery | Admitting: Surgery

## 2014-02-27 ENCOUNTER — Emergency Department (HOSPITAL_BASED_OUTPATIENT_CLINIC_OR_DEPARTMENT_OTHER): Payer: Self-pay

## 2014-02-27 DIAGNOSIS — R1011 Right upper quadrant pain: Secondary | ICD-10-CM

## 2014-02-27 DIAGNOSIS — Z87891 Personal history of nicotine dependence: Secondary | ICD-10-CM | POA: Insufficient documentation

## 2014-02-27 DIAGNOSIS — Z87440 Personal history of urinary (tract) infections: Secondary | ICD-10-CM | POA: Insufficient documentation

## 2014-02-27 DIAGNOSIS — K8020 Calculus of gallbladder without cholecystitis without obstruction: Secondary | ICD-10-CM

## 2014-02-27 DIAGNOSIS — K8010 Calculus of gallbladder with chronic cholecystitis without obstruction: Secondary | ICD-10-CM | POA: Diagnosis present

## 2014-02-27 DIAGNOSIS — K219 Gastro-esophageal reflux disease without esophagitis: Secondary | ICD-10-CM | POA: Insufficient documentation

## 2014-02-27 DIAGNOSIS — K8000 Calculus of gallbladder with acute cholecystitis without obstruction: Principal | ICD-10-CM | POA: Insufficient documentation

## 2014-02-27 LAB — URINE MICROSCOPIC-ADD ON

## 2014-02-27 LAB — CBC WITH DIFFERENTIAL/PLATELET
BASOS ABS: 0 10*3/uL (ref 0.0–0.1)
BASOS PCT: 0 % (ref 0–1)
EOS ABS: 0.1 10*3/uL (ref 0.0–0.7)
Eosinophils Relative: 1 % (ref 0–5)
HCT: 36.1 % (ref 36.0–46.0)
Hemoglobin: 11.9 g/dL — ABNORMAL LOW (ref 12.0–15.0)
Lymphocytes Relative: 21 % (ref 12–46)
Lymphs Abs: 1.5 10*3/uL (ref 0.7–4.0)
MCH: 30.3 pg (ref 26.0–34.0)
MCHC: 33 g/dL (ref 30.0–36.0)
MCV: 91.9 fL (ref 78.0–100.0)
Monocytes Absolute: 0.5 10*3/uL (ref 0.1–1.0)
Monocytes Relative: 7 % (ref 3–12)
NEUTROS ABS: 5.1 10*3/uL (ref 1.7–7.7)
NEUTROS PCT: 70 % (ref 43–77)
PLATELETS: 372 10*3/uL (ref 150–400)
RBC: 3.93 MIL/uL (ref 3.87–5.11)
RDW: 13.4 % (ref 11.5–15.5)
WBC: 7.3 10*3/uL (ref 4.0–10.5)

## 2014-02-27 LAB — URINALYSIS, ROUTINE W REFLEX MICROSCOPIC
Bilirubin Urine: NEGATIVE
GLUCOSE, UA: NEGATIVE mg/dL
Hgb urine dipstick: NEGATIVE
KETONES UR: NEGATIVE mg/dL
LEUKOCYTES UA: NEGATIVE
NITRITE: NEGATIVE
PROTEIN: 30 mg/dL — AB
Specific Gravity, Urine: 1.016 (ref 1.005–1.030)
Urobilinogen, UA: 0.2 mg/dL (ref 0.0–1.0)
pH: 8.5 — ABNORMAL HIGH (ref 5.0–8.0)

## 2014-02-27 LAB — COMPREHENSIVE METABOLIC PANEL
ALBUMIN: 3.8 g/dL (ref 3.5–5.2)
ALK PHOS: 68 U/L (ref 39–117)
ALT: 22 U/L (ref 0–35)
AST: 21 U/L (ref 0–37)
BILIRUBIN TOTAL: 0.3 mg/dL (ref 0.3–1.2)
BUN: 12 mg/dL (ref 6–23)
CHLORIDE: 102 meq/L (ref 96–112)
CO2: 27 mEq/L (ref 19–32)
Calcium: 9.2 mg/dL (ref 8.4–10.5)
Creatinine, Ser: 0.7 mg/dL (ref 0.50–1.10)
GFR calc Af Amer: >90 mL/min (ref >90)
GFR calc non Af Amer: >90 mL/min (ref >90)
Glucose, Bld: 109 mg/dL — ABNORMAL HIGH (ref 70–99)
POTASSIUM: 3.9 meq/L (ref 3.7–5.3)
SODIUM: 139 meq/L (ref 137–147)
TOTAL PROTEIN: 8.1 g/dL (ref 6.0–8.3)

## 2014-02-27 LAB — PREGNANCY, URINE: Preg Test, Ur: NEGATIVE

## 2014-02-27 LAB — LIPASE, BLOOD: Lipase: 18 U/L (ref 11–59)

## 2014-02-27 LAB — SURGICAL PCR SCREEN
MRSA, PCR: INVALID — AB
STAPHYLOCOCCUS AUREUS: INVALID — AB

## 2014-02-27 MED ORDER — DIPHENHYDRAMINE HCL 50 MG/ML IJ SOLN: 12.5000 mg | Freq: Four times a day (QID) | INTRAMUSCULAR | Status: DC | PRN

## 2014-02-27 MED ORDER — POTASSIUM CHLORIDE IN NACL 20-0.9 MEQ/L-% IV SOLN
INTRAVENOUS | Status: DC
  Administered 2014-02-27 – 2014-03-01 (×3): via INTRAVENOUS
  Filled 2014-02-27 (×6): qty 1000

## 2014-02-27 MED ORDER — MORPHINE SULFATE 4 MG/ML IJ SOLN
4.0000 mg | INTRAMUSCULAR | Status: DC | PRN
Start: 2014-02-27 — End: 2014-03-01
  Administered 2014-02-27: 4 mg via INTRAVENOUS
  Filled 2014-02-27: qty 1

## 2014-02-27 MED ORDER — METOCLOPRAMIDE HCL 5 MG/ML IJ SOLN
10.0000 mg | Freq: Once | INTRAMUSCULAR | Status: AC
  Administered 2014-02-27: 10 mg via INTRAVENOUS
  Filled 2014-02-27: qty 2

## 2014-02-27 MED ORDER — HYDROMORPHONE HCL PF 1 MG/ML IJ SOLN
0.5000 mg | INTRAMUSCULAR | Status: DC | PRN
  Administered 2014-02-27 – 2014-03-01 (×10): 1 mg via INTRAVENOUS
  Filled 2014-02-27 (×10): qty 1

## 2014-02-27 MED ORDER — DIPHENHYDRAMINE HCL 12.5 MG/5ML PO ELIX
12.5000 mg | ORAL_SOLUTION | Freq: Four times a day (QID) | ORAL | Status: DC | PRN
  Administered 2014-02-28: 25 mg via ORAL
  Filled 2014-02-27: qty 10

## 2014-02-27 MED ORDER — SODIUM CHLORIDE 0.9 % IV BOLUS (SEPSIS)
500.0000 mL | Freq: Once | INTRAVENOUS | Status: AC
  Administered 2014-02-27: 500 mL via INTRAVENOUS

## 2014-02-27 MED ORDER — PANTOPRAZOLE SODIUM 40 MG IV SOLR
40.0000 mg | Freq: Every day | INTRAVENOUS | Status: DC
  Administered 2014-02-27 – 2014-02-28 (×2): 40 mg via INTRAVENOUS
  Filled 2014-02-27 (×3): qty 40

## 2014-02-27 MED ORDER — ONDANSETRON HCL 4 MG/2ML IJ SOLN
4.0000 mg | Freq: Four times a day (QID) | INTRAMUSCULAR | Status: DC | PRN
  Administered 2014-02-27 – 2014-02-28 (×3): 4 mg via INTRAVENOUS
  Filled 2014-02-27 (×3): qty 2

## 2014-02-27 MED ORDER — DIPHENHYDRAMINE HCL 50 MG/ML IJ SOLN
25.0000 mg | Freq: Once | INTRAMUSCULAR | Status: AC
  Administered 2014-02-27: 25 mg via INTRAVENOUS
  Filled 2014-02-27: qty 1

## 2014-02-27 MED ORDER — SODIUM CHLORIDE 0.9 % IV SOLN
3.0000 g | Freq: Four times a day (QID) | INTRAVENOUS | Status: DC
  Administered 2014-02-27 – 2014-03-01 (×8): 3 g via INTRAVENOUS
  Filled 2014-02-27 (×10): qty 3

## 2014-02-27 MED ORDER — MORPHINE SULFATE 4 MG/ML IJ SOLN
4.0000 mg | Freq: Once | INTRAMUSCULAR | Status: AC
  Administered 2014-02-27: 4 mg via INTRAVENOUS
  Filled 2014-02-27: qty 1

## 2014-02-27 MED ORDER — CEFAZOLIN SODIUM-DEXTROSE 2-3 GM-% IV SOLR
2.0000 g | Freq: Three times a day (TID) | INTRAVENOUS | Status: DC
  Filled 2014-02-27 (×2): qty 50

## 2014-02-27 MED ORDER — LORAZEPAM 2 MG/ML IJ SOLN
1.0000 mg | Freq: Once | INTRAMUSCULAR | Status: AC
  Administered 2014-02-27: 1 mg via INTRAVENOUS
  Filled 2014-02-27: qty 1

## 2014-02-27 MED ORDER — ONDANSETRON HCL 4 MG/2ML IJ SOLN
4.0000 mg | Freq: Once | INTRAMUSCULAR | Status: AC
  Administered 2014-02-27: 4 mg via INTRAVENOUS
  Filled 2014-02-27: qty 2

## 2014-02-27 NOTE — ED Notes (Signed)
Bed: ZO10WA22 Expected date:  Expected time:  Means of arrival:  Comments[facility]

## 2014-02-27 NOTE — ED Notes (Signed)
PA for surgery at bedside.  

## 2014-02-27 NOTE — ED Notes (Signed)
Report given to shelly rn with [facility]

## 2014-02-27 NOTE — ED Notes (Signed)
[facility] [place] [facility] on arrival

## 2014-02-27 NOTE — Progress Notes (Signed)
[facility] did not get to see patient but will be sending information about [facility], using the address provided.

## 2014-02-27 NOTE — H&P (Signed)
Cassandra Summers is an 38 y.o. female.   Chief Complaint: ACUTE ONSET OF RUQ pain this AM, nausea at home, vomiting at [facility] just prior to transfer to [facility].  HPI: This is a healthy 38 y/o female who woke up at 4AM with acute onset of pain RUQ going to her back.  She had some nausea at home, but no vomiting. This is her first episode of pain like this, nothing like this before.  She went to the ER at [facility] at [place] are normal, but US shows:  Multiple gallstones are noted. There appears to be a stone within the gallbladder neck which is non mobile. Mild wall thickening to 4 mm is seen. Positive sonographic Percell Miller sign is elicited.  She continues to have significant pain and was transferred to [facility] for evaluation.  She continues to have pain here in RUQ going to the back.  She is has not eaten since about 7 PM last evening.  We are ask to see.    History reviewed. No pertinent past medical history.  Past Surgical History  Procedure Laterality Date  GERD    Prior hx of UTI/STI    TOBACCO USE Quit 01/2014   Tubal ligation      Family hx: Both parents living, Mother in good health, Father with CHF, prior coronary stent, Rheumatoid arthritis, AODM 3 brothers and 3 sisters in good health Social History:  Single she has 3 children age 64, 6, 70, she is currently unemployed and live with SO.. Tobacco - 15 years, quit in Feb 2015 Etoh:  Social Drugs:  none  Allergies: No Known Allergies  Prior to Admission medications   Medication Sig Start Date End Date Taking? Authorizing Provider  ranitidine (ZANTAC) 150 MG tablet Take 150 mg by mouth daily.   Yes Historical Provider, MD     Results for orders placed during the hospital encounter of 02/27/14 (from the past 48 hour(s))  URINALYSIS, ROUTINE W REFLEX MICROSCOPIC     Status: Abnormal   Collection Time    02/27/14  9:59 AM      Result Value Ref Range   Color, Urine YELLOW  YELLOW   APPearance CLOUDY (*) CLEAR   Specific Gravity, Urine  1.016  1.005 - 1.030   pH 8.5 (*) 5.0 - 8.0   Glucose, UA NEGATIVE  NEGATIVE mg/dL   Hgb urine dipstick NEGATIVE  NEGATIVE   Bilirubin Urine NEGATIVE  NEGATIVE   Ketones, ur NEGATIVE  NEGATIVE mg/dL   Protein, ur 30 (*) NEGATIVE mg/dL   Urobilinogen, UA 0.2  0.0 - 1.0 mg/dL   Nitrite NEGATIVE  NEGATIVE   Leukocytes, UA NEGATIVE  NEGATIVE  PREGNANCY, URINE     Status: None   Collection Time    02/27/14  9:59 AM      Result Value Ref Range   Preg Test, Ur NEGATIVE  NEGATIVE   Comment:            THE SENSITIVITY OF THIS     METHODOLOGY IS >20 mIU/mL.  URINE MICROSCOPIC-ADD ON     Status: Abnormal   Collection Time    02/27/14  9:59 AM      Result Value Ref Range   Squamous Epithelial / LPF FEW (*) RARE   WBC, UA 0-2  <3 WBC/hpf   RBC / HPF 0-2  <3 RBC/hpf   Bacteria, UA MANY (*) RARE  CBC WITH DIFFERENTIAL     Status: Abnormal   Collection Time  02/27/14 10:26 AM      Result Value Ref Range   WBC 7.3  4.0 - 10.5 K/uL   RBC 3.93  3.87 - 5.11 MIL/uL   Hemoglobin 11.9 (*) 12.0 - 15.0 g/dL   HCT 36.1  36.0 - 46.0 %   MCV 91.9  78.0 - 100.0 fL   MCH 30.3  26.0 - 34.0 pg   MCHC 33.0  30.0 - 36.0 g/dL   RDW 13.4  11.5 - 15.5 %   Platelets 372  150 - 400 K/uL   Neutrophils Relative % 70  43 - 77 %   Neutro Abs 5.1  1.7 - 7.7 K/uL   Lymphocytes Relative 21  12 - 46 %   Lymphs Abs 1.5  0.7 - 4.0 K/uL   Monocytes Relative 7  3 - 12 %   Monocytes Absolute 0.5  0.1 - 1.0 K/uL   Eosinophils Relative 1  0 - 5 %   Eosinophils Absolute 0.1  0.0 - 0.7 K/uL   Basophils Relative 0  0 - 1 %   Basophils Absolute 0.0  0.0 - 0.1 K/uL  COMPREHENSIVE METABOLIC PANEL     Status: Abnormal   Collection Time    02/27/14 10:26 AM      Result Value Ref Range   Sodium 139  137 - 147 mEq/L   Potassium 3.9  3.7 - 5.3 mEq/L   Chloride 102  96 - 112 mEq/L   CO2 27  19 - 32 mEq/L   Glucose, Bld 109 (*) 70 - 99 mg/dL   BUN 12  6 - 23 mg/dL   Creatinine, Ser 0.70  0.50 - 1.10 mg/dL   Calcium 9.2   8.4 - 10.5 mg/dL   Total Protein 8.1  6.0 - 8.3 g/dL   Albumin 3.8  3.5 - 5.2 g/dL   AST 21  0 - 37 U/L   ALT 22  0 - 35 U/L   Alkaline Phosphatase 68  39 - 117 U/L   Total Bilirubin 0.3  0.3 - 1.2 mg/dL   GFR calc non Af Amer >90  >90 mL/min   GFR calc Af Amer >90  >90 mL/min   Comment: (NOTE)     The eGFR has been calculated using the CKD EPI equation.     This calculation has not been validated in all clinical situations.     eGFR's persistently <90 mL/min signify possible Chronic Kidney     Disease.  LIPASE, BLOOD     Status: None   Collection Time    02/27/14 10:26 AM      Result Value Ref Range   Lipase 18  11 - 59 U/L   US Abdomen Complete  02/27/2014   CLINICAL DATA:  Right upper quadrant pain  EXAM: ULTRASOUND ABDOMEN COMPLETE  COMPARISON:  None.  FINDINGS: Gallbladder:  Multiple gallstones are noted. There appears to be a stone within the gallbladder neck which is non mobile. Mild wall thickening to 4 mm is seen. Positive sonographic Percell Miller sign is elicited.  Common bile duct:  Diameter: 4 mm  Liver:  No focal lesion identified. Within normal limits in parenchymal echogenicity.  IVC:  No abnormality visualized.  Pancreas:  Visualized portion unremarkable.  Spleen:  Size and appearance within normal limits.  Right Kidney:  Length: 12 cm. Echogenicity within normal limits. No mass or hydronephrosis visualized.  Left Kidney:  Length: 10.4 cm. Echogenicity within normal limits. No mass or hydronephrosis visualized.  Abdominal aorta:  No aneurysm visualized.  Other findings:  None.  IMPRESSION: Cholelithiasis with findings suggestive of acute cholecystitis.   Electronically Signed   By: Inez Catalina M.D.   On: 02/27/2014 11:16    Review of Systems  Constitutional: Negative for fever, chills, weight loss, malaise/fatigue and diaphoresis.  HENT: Negative.   Eyes: Negative.   Respiratory: Negative for cough, hemoptysis, sputum production, shortness of breath and wheezing.        She  had a URI about 1 month ago.  Cardiovascular: Negative.        She says her heart skips some.  She notices it when she is quiet.  Gastrointestinal: Positive for heartburn (chronic on H2 for about 3 years), nausea, vomiting (once at [facility] before transfer to [facility]), abdominal pain (acute onset about 4 AM, ist episode of this, better with pain meds.) and constipation (she normally has some constipation issues.). Negative for diarrhea, blood in stool and melena.  Genitourinary: Negative.        She was seen 01/13/14 with odor, some lower abdominal pain, and vaginal discharge.   Workup was negative, and she was place on NSAID, these symptoms resolved.  Musculoskeletal: Negative.   Skin: Negative.   Neurological: Negative.  Negative for weakness.  Endo/Heme/Allergies: Negative.   Psychiatric/Behavioral: Negative.        Says she's anxious now.  She is very sleepy from pain meds before transfer to [facility].    Blood pressure 145/90, pulse 78, temperature 98.2 F (36.8 C), temperature source Oral, resp. rate 18, height 5' 4" (1.626 m), weight 79.379 kg (175 lb), last menstrual period 02/24/2014, SpO2 99.00%. Physical Exam  Constitutional: She is oriented to person, place, and time. She appears well-developed and well-nourished. No distress.  SHE HAS HAD HER PAIN MED BEFORE TRANSFER AND SHE IS VERY TIRED.  sTILL HURTS RUQ AND GOING TO BACK.  HENT:  Head: Normocephalic and atraumatic.  Nose: Nose normal.  Eyes: Conjunctivae and EOM are normal. Pupils are equal, round, and reactive to light. Right eye exhibits no discharge. Left eye exhibits no discharge. No scleral icterus.  Neck: Normal range of motion. Neck supple. No JVD present. No tracheal deviation present. No thyromegaly present.  Cardiovascular: Normal rate, regular rhythm, normal heart sounds and intact distal pulses.  Exam reveals no gallop.   No murmur heard. Respiratory: Effort normal and breath sounds normal. No respiratory distress. She has no  wheezes. She has no rales. She exhibits no tenderness.  GI: Soft. Bowel sounds are normal. She exhibits no distension and no mass. There is tenderness (SHE IS VERY TENDER RUQ, AND IT GOES TO HER BACK.). There is guarding (ruq). There is no rebound.  Musculoskeletal: She exhibits no edema.  Lymphadenopathy:    She has no cervical adenopathy.  Neurological: She is alert and oriented to person, place, and time. No cranial nerve deficit.  Skin: Skin is warm and dry. No rash noted. She is not diaphoretic. No erythema. No pallor.  Psychiatric: She has a normal mood and affect. Her behavior is normal. Judgment and thought content normal.     Assessment/Plan 1.  Acute cholecystitis, cholelithiasis without obstruction 2.  GERD 3  Hx of UTI/STD 4. Hx of Tobacco use, she quit 01/2014. 5.  Hx of tubal ligation, no other surgery reported.  Plan:  Cholecystectomy hopefully later today.   Elin Seats 02/27/2014, 2:19 PM

## 2014-02-27 NOTE — ED Notes (Signed)
Correction to Admitting Provider not Azalia BilisKevin Campos, MD but Admitting Provider Avel Peaceodd Rosenbower

## 2014-02-27 NOTE — ED Notes (Signed)
Right side back pain

## 2014-02-27 NOTE — ED Notes (Signed)
Patient transported to Ultrasound 

## 2014-02-27 NOTE — ED Provider Notes (Signed)
CSN: [id number]     Arrival date & time 02/27/14  0935 History   First MD Initiated Contact with Patient 02/27/14 [phone]     Chief Complaint  Patient presents with  . right back pain    HPI  Cassandra Summers is 38 y.o. female presented to the ED with complaint mid epigastric, RUQ pain that radiates to her back . She has a h/o of gastritis since childhood. She reports any time she normally gets abdominal pain she takes Zantac and Teaching laboratory technician, she tried that yesterday and today without relief.  She reports she awoke out of her sleep with stomach pain. She reports trouble getting comfortable and is rolling around in bed and laying on the floor in fetal position. She reports she feels bloated. She is scared to take anything for her 10/10 stabbing cramping abdominal pain. She endorses nausea and epigastric/RUQl/back pain.  She denies fever, chills,  vomit, headache, diarrhea, constipation or dysuria/frequency. She reports she ate a large dinner last night, shared by her family, no of which are ill. Patient has a h/o tubal ligation. LMP: 02/24/2014. Denies vaginal discharge, irritation or pain.   History reviewed. No pertinent past medical history. Past Surgical History  Procedure Laterality Date  . Tubal ligation     History reviewed. No pertinent family history. History  Substance Use Topics  . Smoking status: Current Some Day Smoker  . Smokeless tobacco: Not on file  . Alcohol Use: No   OB History   Grav Para Term Preterm Abortions TAB SAB Ect Mult Living                 Review of Systems  Constitutional: Positive for activity change and appetite change. Negative for fever, chills and fatigue.  HENT: Negative for ear pain, rhinorrhea, sinus pressure and sore throat.   Eyes: Negative for pain, redness and itching.  Respiratory: Negative for cough, chest tightness, shortness of breath and wheezing.   Cardiovascular: Negative for chest pain and palpitations.  Gastrointestinal: Positive  for nausea, abdominal pain and abdominal distention. Negative for vomiting, diarrhea and constipation.  Genitourinary: Negative for dysuria, hematuria, flank pain, decreased urine volume, vaginal discharge and vaginal pain.  Musculoskeletal: Positive for back pain. Negative for joint swelling and myalgias.  Skin: Negative for rash.   Allergies  Review of patient's allergies indicates no known allergies.  Home Medications   Current Outpatient Rx  Name  Route  Sig  Dispense  Refill  . HYDROcodone-acetaminophen (NORCO/VICODIN) 5-325 MG per tablet   Oral   Take 2 tablets by mouth every 4 (four) hours as needed.   10 tablet   0   . naproxen (NAPROSYN) 500 MG tablet   Oral   Take 1 tablet (500 mg total) by mouth 2 (two) times daily.   30 tablet   0   . ondansetron (ZOFRAN ODT) 4 MG disintegrating tablet   Oral   Take 1 tablet (4 mg total) by mouth every 8 (eight) hours as needed for nausea.   20 tablet   0   . phenazopyridine (PYRIDIUM) 200 MG tablet   Oral   Take 1 tablet (200 mg total) by mouth 3 (three) times daily.   6 tablet   0    BP 141/96  Pulse 77  Temp(Src) 98.3 F (36.8 C) (Oral)  Resp 16  Ht 5\' 4"  (1.626 m)  Wt 175 lb (79.379 kg)  BMI 30.02 kg/m2  SpO2 99%  LMP 02/24/2014 Physical Exam Gen: Appears  nontoxic. Appears in pain. Crying. Pacing, and then lying on floor.  HEENT: AT. [place]. Bilateral TM visualized and normal in appearance. Bilateral eyes without injections or icterus. MMM. Bilateral nares with mild erythema. Throat without erythema or exudates.  CV: RRR, no murmur appreciated.  Chest: CTAB, no wheeze or crackles Abd: Soft. TTP mid epigastric and RUQ. Positive murphy's sign.  Ext: No erythema. No edema.  Skin: No rashes, purpura or petechiae.  Neuro: Normal gait. PERLA. EOMi. Alert. Grossly intact.  Psych: Normal dress, affect and demeanor.   ED Course  Procedures (including critical care time) Labs Review Labs Reviewed  URINALYSIS, ROUTINE W  REFLEX MICROSCOPIC - Abnormal; Notable for the following:    APPearance CLOUDY (*)    pH 8.5 (*)    Protein, ur 30 (*)    All other components within normal limits  CBC WITH DIFFERENTIAL - Abnormal; Notable for the following:    Hemoglobin 11.9 (*)    All other components within normal limits  COMPREHENSIVE METABOLIC PANEL - Abnormal; Notable for the following:    Glucose, Bld 109 (*)    All other components within normal limits  URINE MICROSCOPIC-ADD ON - Abnormal; Notable for the following:    Squamous Epithelial / LPF FEW (*)    Bacteria, UA MANY (*)    All other components within normal limits  PREGNANCY, URINE  LIPASE, BLOOD  H. PYLORI ANTIBODY, IGG   Imaging Review [place] Abdomen Complete  02/27/2014   CLINICAL DATA:  Right upper quadrant pain  EXAM: ULTRASOUND ABDOMEN COMPLETE  COMPARISON:  None.  FINDINGS: Gallbladder:  Multiple gallstones are noted. There appears to be a stone within the gallbladder neck which is non mobile. Mild wall thickening to 4 mm is seen. Positive sonographic Eulah PontMurphy sign is elicited.  Common bile duct:  Diameter: 4 mm  Liver:  No focal lesion identified. Within normal limits in parenchymal echogenicity.  IVC:  No abnormality visualized.  Pancreas:  Visualized portion unremarkable.  Spleen:  Size and appearance within normal limits.  Right Kidney:  Length: 12 cm. Echogenicity within normal limits. No mass or hydronephrosis visualized.  Left Kidney:  Length: 10.4 cm. Echogenicity within normal limits. No mass or hydronephrosis visualized.  Abdominal aorta:  No aneurysm visualized.  Other findings:  None.  IMPRESSION: Cholelithiasis with findings suggestive of acute cholecystitis.   Electronically Signed   By: Alcide CleverMark  Lukens M.D.   On: 02/27/2014 11:16     EKG Interpretation None      MDM   Final diagnoses:  Cholelithiasis with acute cholecystitis   Patient presented to the ED with RUQ and epigastric pain, radiates to her right back. CBC, CMP and lipase are  within normal limits. US with Multiple gallstones including a stone within the gallbladder neck which is non mobile. Mild wall thickening to 4 mm is seen. Positive sonographic Eulah PontMurphy sign was elicited. Patient was given Zofran, morphine, Reglan and benadryl; to help with her symptoms, with mild improvement. Surgery was consulted and [facility] [place].      Natalia Leatherwoodenee A Reality Dejonge, DO 02/27/14 1224

## 2014-02-27 NOTE — ED Provider Notes (Signed)
I saw and evaluated the patient, reviewed the resident's note and I agree with the findings and plan.   EKG Interpretation None     Sent here for upper quadrant pain that has become severe. Abdominal ultrasound is consistent with cholecystitis. No peritoneal signs on abdominal exam. Will be transferred to another hospital to be seen by a surgeon.  Toy BakerAnthony T Luberta Grabinski, MD 02/27/14 1155

## 2014-02-27 NOTE — H&P (Signed)
Patient seen and examined.  She likely has evolving acute cholecystitis versus persistent biliary colic.  WBC and LFTs are normal.  Will plan on cholecystectomy tomorrow as OR time is not available until very late this evening.

## 2014-02-28 ENCOUNTER — Encounter (HOSPITAL_COMMUNITY): Admission: EM | Disposition: A | Discharge status: Home / Self Care | Payer: Self-pay | Source: Home / Self Care

## 2014-02-28 ENCOUNTER — Observation Stay (HOSPITAL_COMMUNITY): Payer: Self-pay | Admitting: Certified Registered Nurse Anesthetist

## 2014-02-28 ENCOUNTER — Encounter (HOSPITAL_COMMUNITY): Payer: Self-pay | Admitting: Certified Registered Nurse Anesthetist

## 2014-02-28 ENCOUNTER — Observation Stay (HOSPITAL_COMMUNITY): Payer: Medicaid Other

## 2014-02-28 DIAGNOSIS — K8000 Calculus of gallbladder with acute cholecystitis without obstruction: Secondary | ICD-10-CM

## 2014-02-28 HISTORY — PX: CHOLECYSTECTOMY: SHX55

## 2014-02-28 LAB — H. PYLORI ANTIBODY, IGG: H PYLORI IGG: 6.82 {ISR} — AB

## 2014-02-28 SURGERY — LAPAROSCOPIC CHOLECYSTECTOMY WITH INTRAOPERATIVE CHOLANGIOGRAM
Anesthesia: General | Site: Abdomen

## 2014-02-28 MED ORDER — PROMETHAZINE HCL 25 MG/ML IJ SOLN: 6.2500 mg | INTRAMUSCULAR | Status: DC | PRN

## 2014-02-28 MED ORDER — PHENYLEPHRINE 40 MCG/ML (10ML) SYRINGE FOR IV PUSH (FOR BLOOD PRESSURE SUPPORT)
PREFILLED_SYRINGE | INTRAVENOUS | Status: AC
  Filled 2014-02-28: qty 10

## 2014-02-28 MED ORDER — DEXAMETHASONE SODIUM PHOSPHATE 10 MG/ML IJ SOLN
INTRAMUSCULAR | Status: DC | PRN
  Administered 2014-02-28: 10 mg via INTRAVENOUS

## 2014-02-28 MED ORDER — METOPROLOL TARTRATE 1 MG/ML IV SOLN
INTRAVENOUS | Status: DC | PRN
  Administered 2014-02-28 (×2): 1 mg via INTRAVENOUS

## 2014-02-28 MED ORDER — PROPOFOL 10 MG/ML IV BOLUS
INTRAVENOUS | Status: AC
  Filled 2014-02-28: qty 20

## 2014-02-28 MED ORDER — OXYCODONE HCL 5 MG PO TABS
5.0000 mg | ORAL_TABLET | ORAL | Status: DC | PRN
  Administered 2014-02-28 – 2014-03-01 (×4): 10 mg via ORAL
  Filled 2014-02-28 (×4): qty 2

## 2014-02-28 MED ORDER — LIDOCAINE HCL (CARDIAC) 20 MG/ML IV SOLN
INTRAVENOUS | Status: AC
  Filled 2014-02-28: qty 5

## 2014-02-28 MED ORDER — BUPIVACAINE HCL 0.5 % IJ SOLN
INTRAMUSCULAR | Status: DC | PRN
  Administered 2014-02-28: 15 mL

## 2014-02-28 MED ORDER — ROCURONIUM BROMIDE 100 MG/10ML IV SOLN
INTRAVENOUS | Status: DC | PRN
  Administered 2014-02-28: 5 mg via INTRAVENOUS
  Administered 2014-02-28: 50 mg via INTRAVENOUS

## 2014-02-28 MED ORDER — LACTATED RINGERS IV SOLN
INTRAVENOUS | Status: DC
  Administered 2014-02-28: 13:00:00 via INTRAVENOUS
  Administered 2014-02-28: 1000 mL via INTRAVENOUS

## 2014-02-28 MED ORDER — MIDAZOLAM HCL 5 MG/5ML IJ SOLN
INTRAMUSCULAR | Status: DC | PRN
  Administered 2014-02-28: 2 mg via INTRAVENOUS

## 2014-02-28 MED ORDER — NEOSTIGMINE METHYLSULFATE 1 MG/ML IJ SOLN
INTRAMUSCULAR | Status: DC | PRN
  Administered 2014-02-28: 4 mg via INTRAVENOUS

## 2014-02-28 MED ORDER — BUPIVACAINE HCL (PF) 0.5 % IJ SOLN
INTRAMUSCULAR | Status: AC
  Filled 2014-02-28: qty 30

## 2014-02-28 MED ORDER — LIDOCAINE HCL (CARDIAC) 20 MG/ML IV SOLN
INTRAVENOUS | Status: DC | PRN
  Administered 2014-02-28: 60 mg via INTRAVENOUS

## 2014-02-28 MED ORDER — FENTANYL CITRATE 0.05 MG/ML IJ SOLN
INTRAMUSCULAR | Status: DC | PRN
  Administered 2014-02-28 (×2): 100 ug via INTRAVENOUS
  Administered 2014-02-28: 50 ug via INTRAVENOUS

## 2014-02-28 MED ORDER — ESMOLOL HCL 10 MG/ML IV SOLN
INTRAVENOUS | Status: DC | PRN
  Administered 2014-02-28 (×2): 30 mg via INTRAVENOUS

## 2014-02-28 MED ORDER — NEOSTIGMINE METHYLSULFATE 1 MG/ML IJ SOLN
INTRAMUSCULAR | Status: AC
  Filled 2014-02-28: qty 10

## 2014-02-28 MED ORDER — FENTANYL CITRATE 0.05 MG/ML IJ SOLN
INTRAMUSCULAR | Status: AC
  Filled 2014-02-28: qty 5

## 2014-02-28 MED ORDER — SODIUM CHLORIDE 0.9 % IJ SOLN
INTRAMUSCULAR | Status: AC
  Filled 2014-02-28: qty 10

## 2014-02-28 MED ORDER — HYDROMORPHONE HCL PF 2 MG/ML IJ SOLN
INTRAMUSCULAR | Status: AC
  Filled 2014-02-28: qty 1

## 2014-02-28 MED ORDER — GLYCOPYRROLATE 0.2 MG/ML IJ SOLN
INTRAMUSCULAR | Status: DC | PRN
  Administered 2014-02-28: .5 mg via INTRAVENOUS

## 2014-02-28 MED ORDER — IOHEXOL 300 MG/ML  SOLN
INTRAMUSCULAR | Status: DC | PRN
  Administered 2014-02-28: 1.5 mL via INTRAVENOUS

## 2014-02-28 MED ORDER — ROCURONIUM BROMIDE 100 MG/10ML IV SOLN
INTRAVENOUS | Status: AC
  Filled 2014-02-28: qty 1

## 2014-02-28 MED ORDER — KETOROLAC TROMETHAMINE 30 MG/ML IJ SOLN: 15.0000 mg | Freq: Once | INTRAMUSCULAR | Status: DC | PRN

## 2014-02-28 MED ORDER — EPHEDRINE SULFATE 50 MG/ML IJ SOLN
INTRAMUSCULAR | Status: AC
  Filled 2014-02-28: qty 1

## 2014-02-28 MED ORDER — HYDROMORPHONE HCL PF 1 MG/ML IJ SOLN: 0.2500 mg | INTRAMUSCULAR | Status: DC | PRN

## 2014-02-28 MED ORDER — DEXAMETHASONE SODIUM PHOSPHATE 10 MG/ML IJ SOLN
INTRAMUSCULAR | Status: AC
  Filled 2014-02-28: qty 1

## 2014-02-28 MED ORDER — MIDAZOLAM HCL 2 MG/2ML IJ SOLN
INTRAMUSCULAR | Status: AC
  Filled 2014-02-28: qty 2

## 2014-02-28 MED ORDER — LIP MEDEX EX OINT
TOPICAL_OINTMENT | CUTANEOUS | Status: AC
  Administered 2014-02-28: 15:00:00
  Filled 2014-02-28: qty 7

## 2014-02-28 MED ORDER — PROPOFOL 10 MG/ML IV BOLUS
INTRAVENOUS | Status: DC | PRN
  Administered 2014-02-28: 150 mg via INTRAVENOUS
  Administered 2014-02-28: 50 mg via INTRAVENOUS

## 2014-02-28 MED ORDER — ONDANSETRON HCL 4 MG/2ML IJ SOLN
INTRAMUSCULAR | Status: DC | PRN
  Administered 2014-02-28: 4 mg via INTRAVENOUS

## 2014-02-28 MED ORDER — GLYCOPYRROLATE 0.2 MG/ML IJ SOLN
INTRAMUSCULAR | Status: AC
  Filled 2014-02-28: qty 3

## 2014-02-28 MED ORDER — ONDANSETRON HCL 4 MG/2ML IJ SOLN
INTRAMUSCULAR | Status: AC
  Filled 2014-02-28: qty 2

## 2014-02-28 MED ORDER — PHENYLEPHRINE HCL 10 MG/ML IJ SOLN
INTRAMUSCULAR | Status: DC | PRN
  Administered 2014-02-28: 80 ug via INTRAVENOUS
  Administered 2014-02-28: 60 ug via INTRAVENOUS

## 2014-02-28 MED ORDER — HYDROMORPHONE HCL PF 1 MG/ML IJ SOLN
INTRAMUSCULAR | Status: DC | PRN
  Administered 2014-02-28 (×2): 0.5 mg via INTRAVENOUS
  Administered 2014-02-28: 1 mg via INTRAVENOUS

## 2014-02-28 SURGICAL SUPPLY — 37 items
APPLIER CLIP 5 13 M/L LIGAMAX5 (MISCELLANEOUS) ×3
BENZOIN TINCTURE PRP APPL 2/3 (GAUZE/BANDAGES/DRESSINGS) ×3 IMPLANT
CHLORAPREP W/TINT 26ML (MISCELLANEOUS) ×3 IMPLANT
CLIP APPLIE 5 13 M/L LIGAMAX5 (MISCELLANEOUS) ×1 IMPLANT
CLOSURE WOUND 1/2 X4 (GAUZE/BANDAGES/DRESSINGS) ×1
COVER MAYO STAND STRL (DRAPES) ×3 IMPLANT
DECANTER SPIKE VIAL GLASS SM (MISCELLANEOUS) ×3 IMPLANT
DRAPE C-ARM 42X120 X-RAY (DRAPES) ×3 IMPLANT
DRAPE LAPAROSCOPIC ABDOMINAL (DRAPES) ×3 IMPLANT
DRAPE UTILITY XL STRL (DRAPES) ×3 IMPLANT
DRSG TEGADERM 2-3/8X2-3/4 SM (GAUZE/BANDAGES/DRESSINGS) ×9 IMPLANT
ELECT REM PT RETURN 9FT ADLT (ELECTROSURGICAL) ×3
ELECTRODE REM PT RTRN 9FT ADLT (ELECTROSURGICAL) ×1 IMPLANT
ENDOLOOP SUT PDS II  0 18 (SUTURE)
ENDOLOOP SUT PDS II 0 18 (SUTURE) IMPLANT
GAUZE SPONGE 2X2 8PLY STRL LF (GAUZE/BANDAGES/DRESSINGS) ×1 IMPLANT
GLOVE ECLIPSE 8.0 STRL XLNG CF (GLOVE) ×3 IMPLANT
GLOVE INDICATOR 8.0 STRL GRN (GLOVE) ×3 IMPLANT
GOWN STRL REUS W/TWL XL LVL3 (GOWN DISPOSABLE) ×9 IMPLANT
HEMOSTAT SNOW SURGICEL 2X4 (HEMOSTASIS) IMPLANT
KIT BASIN OR (CUSTOM PROCEDURE TRAY) ×3 IMPLANT
POUCH SPECIMEN RETRIEVAL 10MM (ENDOMECHANICALS) ×3 IMPLANT
SCISSORS LAP 5X35 DISP (ENDOMECHANICALS) ×3 IMPLANT
SET CHOLANGIOGRAPH MIX (MISCELLANEOUS) ×3 IMPLANT
SET IRRIG TUBING LAPAROSCOPIC (IRRIGATION / IRRIGATOR) ×3 IMPLANT
SLEEVE XCEL OPT CAN 5 100 (ENDOMECHANICALS) ×6 IMPLANT
SOLUTION ANTI FOG 6CC (MISCELLANEOUS) ×3 IMPLANT
SPONGE GAUZE 2X2 STER 10/PKG (GAUZE/BANDAGES/DRESSINGS) ×2
STRIP CLOSURE SKIN 1/2X4 (GAUZE/BANDAGES/DRESSINGS) ×2 IMPLANT
SUT MNCRL AB 4-0 PS2 18 (SUTURE) ×3 IMPLANT
TOWEL OR 17X26 10 PK STRL BLUE (TOWEL DISPOSABLE) ×3 IMPLANT
TOWEL OR NON WOVEN STRL DISP B (DISPOSABLE) ×3 IMPLANT
TRAY LAP CHOLE (CUSTOM PROCEDURE TRAY) ×3 IMPLANT
TROCAR BLADELESS OPT 5 100 (ENDOMECHANICALS) ×3 IMPLANT
TROCAR XCEL BLUNT TIP 100MML (ENDOMECHANICALS) ×3 IMPLANT
TROCAR XCEL NON-BLD 11X100MML (ENDOMECHANICALS) IMPLANT
TUBING INSUFFLATION 10FT LAP (TUBING) ×3 IMPLANT

## 2014-02-28 NOTE — Progress Notes (Signed)
Patient seen and examined.  Agree with PA's note.  

## 2014-02-28 NOTE — Anesthesia Postprocedure Evaluation (Signed)
  Anesthesia Post-op Note  Patient: Cassandra RotaClarissa Summers  Procedure(s) Performed: Procedure(s) (LRB): LAPAROSCOPIC CHOLECYSTECTOMY WITH INTRAOPERATIVE CHOLANGIOGRAM (N/A)  Patient Location: PACU  Anesthesia Type: General  Level of Consciousness: awake and alert   Airway and Oxygen Therapy: Patient Spontanous Breathing  Post-op Pain: mild  Post-op Assessment: Post-op Vital signs reviewed, Patient's Cardiovascular Status Stable, Respiratory Function Stable, Patent Airway and No signs of Nausea or vomiting  Last Vitals:  Filed Vitals:   02/28/14 1345  BP: 111/58  Pulse: 75  Temp:   Resp: 16    Post-op Vital Signs: stable   Complications: No apparent anesthesia complications

## 2014-02-28 NOTE — Discharge Instructions (Addendum)
[facility] ______[facility] LAPAROSCOPIC SURGERY: POST OP INSTRUCTIONS Always review your discharge instruction sheet given to you by the facility where your surgery was performed. IF YOU HAVE DISABILITY OR FAMILY LEAVE FORMS, YOU MUST BRING THEM TO THE OFFICE FOR PROCESSING.   DO NOT GIVE THEM TO YOUR DOCTOR.  1. A prescription for pain medication may be given to you upon discharge.  Take your pain medication as prescribed, if needed.  If narcotic pain medicine is not needed, then you may take acetaminophen (Tylenol) or ibuprofen (Advil) as needed. 2. Take your usually prescribed medications unless otherwise directed. 3. If you need a refill on your pain medication, please contact your pharmacy.  They will contact our office to request authorization. Prescriptions will not be filled after 5pm or on week-ends. 4. You should follow a light diet the first few days after arrival home, such as soup and crackers, etc.  Be sure to include lots of fluids daily. 5. Most patients will experience some swelling and bruising in the area of the incisions.  Ice packs will help.  Swelling and bruising can take several days to resolve.  6. It is common to experience some constipation if taking pain medication after surgery.  Increasing fluid intake and taking a stool softener (such as Colace) will usually help or prevent this problem from occurring.  A mild laxative (Milk of Magnesia or Miralax) should be taken according to package instructions if there are no bowel movements after 48 hours. 7. Unless discharge instructions indicate otherwise, you may remove your bandages 72 hours after surgery, and you may shower at that time.  You may have steri-strips (small skin tapes) in place directly over the incision.  These strips should be left on the skin for 14 days.  If your surgeon used skin glue on the incision, you may shower in 24 hours.  The glue will flake off over the next 2-3 weeks.  Any sutures or  staples will be removed at the office during your follow-up visit. 8. ACTIVITIES:  You may resume regular (light) daily activities beginning the next day--such as daily self-care, walking, climbing stairs--gradually increasing activities as tolerated.  You may have sexual intercourse when it is comfortable.  Refrain from any heavy lifting or straining-nothing over 10 pounds for 2 weeks.  a. You may drive when you are no longer taking prescription pain medication, you can comfortably wear a seatbelt, and you can safely maneuver your car and apply brakes. b. RETURN TO WORK:  _Desk work in 5-7 days, full duty in 2 weeks._________________________________________________________ 9. You should see your doctor in the office for a follow-up appointment approximately 2-3 weeks after your surgery.  Make sure that you call for this appointment within a day or two after you arrive home to insure a convenient appointment time. 10. OTHER INSTRUCTIONS: __________________________________________________________________________________________________________________________ __________________________________________________________________________________________________________________________ WHEN TO CALL YOUR DOCTOR: 1. Fever over 101.0 2. Inability to urinate 3. Continued bleeding from incision. 4. Increased pain, redness, or drainage from the incision. 5. Increasing abdominal pain  The clinic staff is available to answer your questions during regular business hours.  Please dont hesitate to call and ask to speak to one of the nurses for clinical concerns.  If you have a medical emergency, go to the nearest emergency room or call 911.  A surgeon from [facility] is always on call at the hospital. [address], [place], [place]  [address] ? [address] [address], [place], [place]  [id number] [phone] ? [phone] ? FAX [phone] Web site:  [email or web address]      Cholecystitis  Cholecystitis is swelling and irritation (inflammation) of your gallbladder. This often happens when gallstones or sludge build up in the gallbladder. Treatment is needed right away. HOME CARE Home care depends on how you were treated. In general:  If you were given antibiotic medicine, take it as told. Finish the medicine even if you start to feel better.  Only take medicines as told by your doctor.  Eat low-fat foods until your next doctor visit.  Keep all doctor visits as told. GET HELP RIGHT AWAY IF:  You have more pain and medicine does not help.  Your pain moves to a different part of your belly (abdomen) or to your back.  You have a fever.  You feel sick to your stomach (nauseous).  You throw up (vomit). MAKE SURE YOU:  Understand these instructions.  Will watch your condition.  Will get help right away if you are not doing well or get worse. Document Released: 11/20/2011 Document Revised: 02/23/2012 Document Reviewed: 11/20/2011 [facility] Patient Information 2014 [place], [place].

## 2014-02-28 NOTE — Transfer of Care (Signed)
Immediate Anesthesia Transfer of Care Note  Patient: Cassandra Summers  Procedure(s) Performed: Procedure(s) (LRB): LAPAROSCOPIC CHOLECYSTECTOMY WITH INTRAOPERATIVE CHOLANGIOGRAM (N/A)  Patient Location: PACU  Anesthesia Type: General  Level of Consciousness: sedated, patient cooperative and responds to stimulation  Airway & Oxygen Therapy: Patient Spontanous Breathing and Patient connected to face mask oxgen  Post-op Assessment: Report given to PACU RN and Post -op Vital signs reviewed and stable  Post vital signs: Reviewed and stable  Complications: No apparent anesthesia complications

## 2014-02-28 NOTE — Anesthesia Preprocedure Evaluation (Addendum)
Anesthesia Evaluation  Patient identified by MRN, date of birth, ID band Patient awake    Reviewed: Allergy & Precautions, H&P , NPO status , Patient's Chart, lab work & pertinent test results  Airway Mallampati: II TM Distance: >3 FB Neck ROM: Full    Dental no notable dental hx.    Pulmonary Current Smoker,  breath sounds clear to auscultation  Pulmonary exam normal       Cardiovascular negative cardio ROS  Rhythm:Regular Rate:Normal     Neuro/Psych negative neurological ROS  negative psych ROS   GI/Hepatic negative GI ROS, Neg liver ROS,   Endo/Other  negative endocrine ROS  Renal/GU negative Renal ROS  negative genitourinary   Musculoskeletal negative musculoskeletal ROS (+)   Abdominal   Peds negative pediatric ROS (+)  Hematology negative hematology ROS (+)   Anesthesia Other Findings   Reproductive/Obstetrics negative OB ROS                          Anesthesia Physical Anesthesia Plan  ASA: II  Anesthesia Plan: General   Post-op Pain Management:    Induction: Intravenous  Airway Management Planned: Oral ETT  Additional Equipment:   Intra-op Plan:   Post-operative Plan: Extubation in OR  Informed Consent: I have reviewed the patients History and Physical, chart, labs and discussed the procedure including the risks, benefits and alternatives for the proposed anesthesia with the patient or authorized representative who has indicated his/her understanding and acceptance.   Dental advisory given  Plan Discussed with: CRNA and Surgeon  Anesthesia Plan Comments:         Anesthesia Quick Evaluation  

## 2014-02-28 NOTE — Progress Notes (Signed)
UR completed 

## 2014-02-28 NOTE — Op Note (Signed)
Operative Note  Miguel RotaClarissa Braaksma female 38 y.o. 02/28/2014  PREOPERATIVE DX:  Acute calculus cholecystitis  POSTOPERATIVE DX:  Same  PROCEDURE:Laparoscopic cholecystectomy and intraoperative cholangiogram         Surgeon: Adolph PollackOSENBOWER,Amonte Brookover J   Assistants: Glenna FellowsBenjamin Hoxworth M.D.  Anesthesia: General endotracheal anesthesia  Indications:  This is a 38 year old female admitted yesterday afternoon with acute cholecystitis. She was hydrated and placed on antibiotics overnight and is now brought to the operating room for the above procedure.    Procedure Detail:  She was brought to the operating room placed supine on the operating table and a general anesthetic was given. The abdominal wall was widely sterilely prepped and draped.  Marcaine solution was infiltrated in the subumbilical region. A small incision was made transversely in the subumbilical area through a previous scar. Blunt dissection was used to expose the fascia. A small incision was made in the fascia and peritoneum and the peritoneal cavity was entered under direct vision. A pursestring suture of 0 Vicryl placed around the fascial edges. A Hassan trocar is introduced into the peritoneal cavity and a pneumoperitoneum was created using carbon dioxide gas.  Laparoscope was introduced and the area deep to it was inspected. There is no evidence of bleeding or organ injury. The gallbladder was visualized and was acutely inflamed and firm. A 5 mm trocar was placed in the epigastric area. Two 5 mm trocars were placed in the right upper quadrant.  Needle aspiration was performed of the gallbladder to decrease the tension. The fundus of the gallbladder was grasped and retracted toward the right shoulder. Adhesions between the omentum and gallbladder were dissected using blunt dissection and electrocautery. An inflammatory peel was also dissected free from the gallbladder. The infundibulum was mobilized. Using blunt dissection identified the  cystic duct close to the gallbladder and created a window around it. An anterior branch of the cystic artery was also identified. A window was created around the artery and it was clipped and divided. The critical view was achieved.  A clip was placed at the cystic duct gallbladder junction. Of note was that there is a very large stone impacted in the gallbladder neck. A small incision was made in the cystic duct. A cholangiocatheter was then placed through the abdominal wall into the cystic duct and a cholangiogram was performed.  Under real-time fluoroscopy dilute contrast was injected into the cystic duct which was of long length. Contrast drained into the common bile duct and then into the duodenum. Contrast also extended superiorly into the common, left, and right hepatic ducts. There was no evidence of obstruction.  Final report is pending the radiologist interpretation.  The cholangiocatheter was removed, the cystic duct was clipped 3 times on the biliary side and divided. Identified the posterior branch of the cystic artery and created a window around it. It was clipped and divided.  The gallbladder was then dissected free from liver using electrocautery and placed in the retrieval bag. It was removed through the subumbilical incision after crushing up the stones. The gallbladder fossa was copiously irrigated. No bleeding or bile leak was noted. The subumbilical fascial defect was closed under laparoscopic vision by tightening up and tying down the purse string suture. The remaining trocars removed the pneumoperitoneum was released.  Skin incisions were then closed with 4-0 Monocryl subcuticular stitches. Steri-Strips and sterile dressings were applied. She tolerated the procedure well clinically apparent complications and was taken to the recovery in satisfactory condition.  Estimated Blood Loss:  200  mL         Drains: none  Blood Given: none          Specimens: gallbladder         Complications:  * No complications entered in OR log *         Disposition: PACU - hemodynamically stable.         Condition: stable

## 2014-02-28 NOTE — Preoperative (Signed)
Beta Blockers   Reason not to administer Beta Blockers:Not Applicable 

## 2014-02-28 NOTE — ED Provider Notes (Signed)
I saw and evaluated the patient, reviewed the resident's note and I agree with the findings and plan.   EKG Interpretation None       Tabby Beaston T Silvia Markuson, MD 02/28/14 0851 

## 2014-02-28 NOTE — Progress Notes (Signed)
Day of Surgery  Subjective: Still having pain RUQ, awaiting surgery  Objective: Vital signs in last 24 hours: Temp:  [97.5 F (36.4 C)-98.9 F (37.2 C)] 98.9 F (37.2 C) (03/17 0352) Pulse Rate:  [64-86] 86 (03/17 0352) Resp:  [16-18] 18 (03/17 0352) BP: (117-149)/(70-96) 117/70 mmHg (03/17 0352) SpO2:  [97 %-100 %] 97 % (03/17 0352) Weight:  [79.379 kg (175 lb)] 79.379 kg (175 lb) (03/16 0946) Last BM Date: 02/26/14 Afebrile, VSS No labs Intake/Output from previous day: 03/16 0701 - 03/17 0700 In: 2998.3 [P.O.:840; I.V.:1858.3; IV Piggyback:300] Out: 1350 [Urine:1350] Intake/Output this shift:    General appearance: alert, cooperative and no distress GI: tender pain ongoing RUQ. feels stuffed.  Lab Results:   Recent Labs  02/27/14 1026  WBC 7.3  HGB 11.9*  HCT 36.1  PLT 372    BMET  Recent Labs  02/27/14 1026  NA 139  K 3.9  CL 102  CO2 27  GLUCOSE 109*  BUN 12  CREATININE 0.70  CALCIUM 9.2   PT/INR No results found for this basename: LABPROT, INR,  in the last 72 hours   Recent Labs Lab 02/27/14 1026  AST 21  ALT 22  ALKPHOS 68  BILITOT 0.3  PROT 8.1  ALBUMIN 3.8     Lipase     Component Value Date/Time   LIPASE 18 02/27/2014 1026     Studies/Results: [place] Abdomen Complete  02/27/2014   CLINICAL DATA:  Right upper quadrant pain  EXAM: ULTRASOUND ABDOMEN COMPLETE  COMPARISON:  None.  FINDINGS: Gallbladder:  Multiple gallstones are noted. There appears to be a stone within the gallbladder neck which is non mobile. Mild wall thickening to 4 mm is seen. Positive sonographic Eulah PontMurphy sign is elicited.  Common bile duct:  Diameter: 4 mm  Liver:  No focal lesion identified. Within normal limits in parenchymal echogenicity.  IVC:  No abnormality visualized.  Pancreas:  Visualized portion unremarkable.  Spleen:  Size and appearance within normal limits.  Right Kidney:  Length: 12 cm. Echogenicity within normal limits. No mass or hydronephrosis  visualized.  Left Kidney:  Length: 10.4 cm. Echogenicity within normal limits. No mass or hydronephrosis visualized.  Abdominal aorta:  No aneurysm visualized.  Other findings:  None.  IMPRESSION: Cholelithiasis with findings suggestive of acute cholecystitis.   Electronically Signed   By: Alcide CleverMark  Lukens M.D.   On: 02/27/2014 11:16    Medications: . ampicillin-sulbactam (UNASYN) IV  3 g Intravenous Q6H  . pantoprazole (PROTONIX) IV  40 mg Intravenous QHS    Assessment/Plan 1. Acute cholecystitis, cholelithiasis without obstruction  2. GERD  3 Hx of UTI/STD  4. Hx of Tobacco use, she quit 01/2014.  5. Hx of tubal ligation, no other surgery reported.    Plan:  Cholecystectomy this AM.    LOS: 1 day    Percilla Tweten 02/28/2014

## 2014-03-01 ENCOUNTER — Encounter (HOSPITAL_COMMUNITY): Payer: Self-pay | Admitting: General Surgery

## 2014-03-01 MED ORDER — OXYCODONE HCL 5 MG PO TABS: 5.0000 mg | ORAL_TABLET | ORAL | Status: DC | PRN

## 2014-03-01 MED ORDER — CIPROFLOXACIN HCL 500 MG PO TABS: 500.0000 mg | ORAL_TABLET | Freq: Two times a day (BID) | ORAL | Status: DC

## 2014-03-01 NOTE — Discharge Summary (Signed)
Physician Discharge Summary  Patient ID: Miguel RotaClarissa Bergen MRN: [id number] DOB/AGE: Nov 24, 1976 37 y.o.  Admit date: 02/27/2014 Discharge date: 03/01/2014  Admission Diagnoses:  Acute cholecystitis  Discharge Diagnoses:   Acute cholecystitis    Discharged Condition: good  Hospital Course: She was admitted and placed on IV antibiotics.  She was taken to the OR on 02/28/14 and underwent a laparoscopic cholecystectomy with IOC on 3/17.  She did well from this and was able to be discharged the next day.  She was given discharge instructions.  Consults: None  Significant Diagnostic Studies: none  Treatments: surgery: As above.  Discharge Exam: Blood pressure 116/70, pulse 78, temperature 98.5 F (36.9 C), temperature source Oral, resp. rate 18, height 5\' 4"  (1.626 m), weight 175 lb (79.379 kg), last menstrual period 02/24/2014, SpO2 95.00%.   Disposition: 01-Home or Self Care     Medication List         oxyCODONE 5 MG immediate release tablet  Commonly known as:  Oxy IR/ROXICODONE  Take 1-2 tablets (5-10 mg total) by mouth every 4 (four) hours as needed for moderate pain.     ranitidine 150 MG tablet  Commonly known as:  ZANTAC  Take 150 mg by mouth daily.         Signed: Adolph PollackOSENBOWER,Kamdin Follett J 03/01/2014, 8:38 AM

## 2014-03-01 NOTE — Progress Notes (Signed)
1 Day Post-Op  Subjective: Sore this AM.  Feels better than she did preop  Objective: Vital signs in last 24 hours: Temp:  [97.8 F (36.6 C)-99.8 F (37.7 C)] 98.5 F (36.9 C) (03/18 0516) Pulse Rate:  [70-102] 78 (03/18 0516) Resp:  [14-18] 18 (03/18 0516) BP: (99-129)/(57-84) 116/70 mmHg (03/18 0516) SpO2:  [95 %-100 %] 95 % (03/18 0516) Last BM Date: 02/26/14  Intake/Output from previous day: 03/17 0701 - 03/18 0700 In: 3683.3 [P.O.:240; I.V.:3143.3; IV Piggyback:300] Out: 1650 [Urine:1650] Intake/Output this shift:    PE: General- In NAD Abdomen-soft, dressings dry  Lab Results:   Recent Labs  02/27/14 1026  WBC 7.3  HGB 11.9*  HCT 36.1  PLT 372   BMET  Recent Labs  02/27/14 1026  NA 139  K 3.9  CL 102  CO2 27  GLUCOSE 109*  BUN 12  CREATININE 0.70  CALCIUM 9.2   PT/INR No results found for this basename: LABPROT, INR,  in the last 72 hours Comprehensive Metabolic Panel:    Component Value Date/Time   NA 139 02/27/2014 1026   NA 143 03/10/2011 2255   K 3.9 02/27/2014 1026   K 4.1 03/10/2011 2255   CL 102 02/27/2014 1026   CL 110 03/10/2011 2255   CO2 27 02/27/2014 1026   CO2 24 03/10/2011 2255   BUN 12 02/27/2014 1026   BUN 12 03/10/2011 2255   CREATININE 0.70 02/27/2014 1026   CREATININE .7 03/10/2011 2255   GLUCOSE 109* 02/27/2014 1026   GLUCOSE 101* 03/10/2011 2255   CALCIUM 9.2 02/27/2014 1026   CALCIUM 9.3 03/10/2011 2255   AST 21 02/27/2014 1026   AST 17 03/10/2011 2255   ALT 22 02/27/2014 1026   ALT 20 03/10/2011 2255   ALKPHOS 68 02/27/2014 1026   ALKPHOS 62 03/10/2011 2255   BILITOT 0.3 02/27/2014 1026   BILITOT 0.4 03/10/2011 2255   PROT 8.1 02/27/2014 1026   PROT 7.7 03/10/2011 2255   ALBUMIN 3.8 02/27/2014 1026   ALBUMIN 3.9 03/10/2011 2255     Studies/Results: Dg Cholangiogram Operative  02/28/2014   CLINICAL DATA:  Cholecystitis, intraoperative cholangiogram  EXAM: INTRAOPERATIVE CHOLANGIOGRAM  FLUOROSCOPY TIME:  Nineteen second   COMPARISON:  US ABDOMEN COMPLETE dated 02/27/2014  FINDINGS: Intraoperative angiographic images of the right upper abdominal quadrant during laparoscopic cholecystectomy are provided for review.  Surgical clips overlie the expected location of the gallbladder fossa.  Contrast injection demonstrates selective cannulation of the central aspect of the cystic duct. There is minimal reflux of contrast into the gallbladder fundus.  There is passage of contrast through the central aspect of the cystic duct with filling of a non dilated common bile duct. There is passage of contrast though the CBD and into the descending portion of the duodenum.  There is minimal reflux of injected contrast into the common hepatic duct and central aspect of the non dilated intrahepatic biliary system.  There are no discrete filling defects within the opacified portions of the biliary system to suggest the presence of choledocholithiasis.  IMPRESSION: No evidence of choledocholithiasis.   Electronically Signed   By: Simonne Come M.D.   On: 02/28/2014 15:34   US Abdomen Complete  02/27/2014   CLINICAL DATA:  Right upper quadrant pain  EXAM: ULTRASOUND ABDOMEN COMPLETE  COMPARISON:  None.  FINDINGS: Gallbladder:  Multiple gallstones are noted. There appears to be a stone within the gallbladder neck which is non mobile. Mild wall thickening to 4 mm is  seen. Positive sonographic Eulah PontMurphy sign is elicited.  Common bile duct:  Diameter: 4 mm  Liver:  No focal lesion identified. Within normal limits in parenchymal echogenicity.  IVC:  No abnormality visualized.  Pancreas:  Visualized portion unremarkable.  Spleen:  Size and appearance within normal limits.  Right Kidney:  Length: 12 cm. Echogenicity within normal limits. No mass or hydronephrosis visualized.  Left Kidney:  Length: 10.4 cm. Echogenicity within normal limits. No mass or hydronephrosis visualized.  Abdominal aorta:  No aneurysm visualized.  Other findings:  None.  IMPRESSION:  Cholelithiasis with findings suggestive of acute cholecystitis.   Electronically Signed   By: Alcide CleverMark  Lukens M.D.   On: 02/27/2014 11:16    Anti-infectives: Anti-infectives   Start     Dose/Rate Route Frequency Ordered Stop   02/27/14 1600  Ampicillin-Sulbactam (UNASYN) 3 g in sodium chloride 0.9 % 100 mL IVPB     3 g 100 mL/hr over 60 Minutes Intravenous Every 6 hours 02/27/14 1527     02/27/14 1500  ceFAZolin (ANCEF) IVPB 2 g/50 mL premix  Status:  Discontinued     2 g 100 mL/hr over 30 Minutes Intravenous 3 times per day 02/27/14 1419 02/27/14 1527      Assessment Active Problems:   Cholelithiasis and acute cholecystitis s/p lap chole 02/28/14-stable overnight    LOS: 2 days   Plan: Discharge on Cipro for 5 more days.  Discharge instructions given.   Cassandra Summers J 03/01/2014

## 2014-03-02 LAB — MRSA CULTURE

## 2014-03-07 NOTE — Addendum Note (Signed)
Addendum created 03/07/14 1031 by Eilene GhaziGeorge Carleena Mires, MD   Modules edited: Anesthesia Attestations

## 2014-11-01 ENCOUNTER — Encounter (HOSPITAL_BASED_OUTPATIENT_CLINIC_OR_DEPARTMENT_OTHER): Payer: Self-pay | Admitting: *Deleted

## 2014-11-01 ENCOUNTER — Emergency Department (HOSPITAL_BASED_OUTPATIENT_CLINIC_OR_DEPARTMENT_OTHER)
Admission: EM | Admit: 2014-11-01 | Discharge: 2014-11-01 | Disposition: A | Discharge status: Home / Self Care | Payer: Medicaid Other | Attending: Emergency Medicine | Admitting: Emergency Medicine

## 2014-11-01 DIAGNOSIS — Z720 Tobacco use: Secondary | ICD-10-CM | POA: Insufficient documentation

## 2014-11-01 DIAGNOSIS — B373 Candidiasis of vulva and vagina: Secondary | ICD-10-CM | POA: Insufficient documentation

## 2014-11-01 DIAGNOSIS — B9689 Other specified bacterial agents as the cause of diseases classified elsewhere: Secondary | ICD-10-CM

## 2014-11-01 DIAGNOSIS — Z3202 Encounter for pregnancy test, result negative: Secondary | ICD-10-CM | POA: Insufficient documentation

## 2014-11-01 DIAGNOSIS — Z79899 Other long term (current) drug therapy: Secondary | ICD-10-CM | POA: Insufficient documentation

## 2014-11-01 DIAGNOSIS — B3731 Acute candidiasis of vulva and vagina: Secondary | ICD-10-CM

## 2014-11-01 DIAGNOSIS — N760 Acute vaginitis: Secondary | ICD-10-CM | POA: Insufficient documentation

## 2014-11-01 LAB — URINALYSIS, ROUTINE W REFLEX MICROSCOPIC
Bilirubin Urine: NEGATIVE
Glucose, UA: NEGATIVE mg/dL
Hgb urine dipstick: NEGATIVE
KETONES UR: NEGATIVE mg/dL
LEUKOCYTES UA: NEGATIVE
NITRITE: NEGATIVE
PH: 5.5 (ref 5.0–8.0)
Protein, ur: NEGATIVE mg/dL
SPECIFIC GRAVITY, URINE: 1.027 (ref 1.005–1.030)
Urobilinogen, UA: 0.2 mg/dL (ref 0.0–1.0)

## 2014-11-01 LAB — WET PREP, GENITAL: Trich, Wet Prep: NONE SEEN

## 2014-11-01 LAB — PREGNANCY, URINE: PREG TEST UR: NEGATIVE

## 2014-11-01 LAB — HIV ANTIBODY (ROUTINE TESTING W REFLEX): HIV: NONREACTIVE

## 2014-11-01 LAB — RPR

## 2014-11-01 MED ORDER — METRONIDAZOLE 500 MG PO TABS: 500.0000 mg | ORAL_TABLET | Freq: Two times a day (BID) | ORAL | Status: DC

## 2014-11-01 MED ORDER — FLUCONAZOLE 150 MG PO TABS: 150.0000 mg | ORAL_TABLET | Freq: Every day | ORAL | Status: DC

## 2014-11-01 MED ORDER — FLUCONAZOLE 50 MG PO TABS
150.0000 mg | ORAL_TABLET | Freq: Every day | ORAL | Status: DC
  Administered 2014-11-01: 150 mg via ORAL
  Filled 2014-11-01 (×4): qty 1

## 2014-11-01 NOTE — ED Notes (Addendum)
Vaginal discharge x 3 days with odor and pelvic pain

## 2014-11-01 NOTE — Discharge Instructions (Signed)
Follow up with your GYN or the [facility] for your year pap smear and physical exam. Return here as needed.

## 2014-11-01 NOTE — ED Provider Notes (Signed)
CSN: [id number]     Arrival date & time 11/01/14  1107 History   First MD Initiated Contact with Patient 11/01/14 1212     Chief Complaint  Patient presents with  . Vaginal Discharge     (Consider location/radiation/quality/duration/timing/severity/associated sxs/prior Treatment) Patient is a 38 y.o. female presenting with vaginal discharge. The history is provided by the patient.  Vaginal Discharge Quality:  Watery and yellow Onset quality:  Gradual Duration:  3 days Timing:  Constant Progression:  Worsening Chronicity:  New Context: spontaneously   Relieved by:  None tried Worsened by:  Nothing tried Ineffective treatments:  None tried Associated symptoms: dyspareunia and dysuria   Associated symptoms: no abdominal pain, no vaginal itching and no vomiting   Risk factors: unprotected sex   Risk factors: no new sexual partner and no STI exposure    Cassandra Summers is a 38 y.o. G3 P3, she is not currently pregnant, BTL for birth control. Current sex partner x 15 months. No hx of STI's. Discharge started 3 days ago and has gotten worse with a bad odor. Similar to when she had BV in the past. Patient states she had some burning with urination a couple days ago but it went away.   History reviewed. No pertinent past medical history. Past Surgical History  Procedure Laterality Date  . Tubal ligation    . Cholecystectomy N/A 02/28/2014    Procedure: LAPAROSCOPIC CHOLECYSTECTOMY WITH INTRAOPERATIVE CHOLANGIOGRAM;  Surgeon: Adolph Pollackodd J Rosenbower, MD;  Location: [facility];  Service: General;  Laterality: N/A;   No family history on file. History  Substance Use Topics  . Smoking status: Current Some Day Smoker    Types: Cigarettes  . Smokeless tobacco: Never Used  . Alcohol Use: Yes     Comment: social   OB History    No data available     Review of Systems  HENT: Negative.   Eyes: Negative for visual disturbance.  Cardiovascular: Negative.   Gastrointestinal: Negative for  vomiting and abdominal pain.  Genitourinary: Positive for dysuria, vaginal discharge and dyspareunia. Negative for urgency and frequency.  Musculoskeletal: Negative for back pain and neck pain.  Skin: Negative for rash.  Neurological: Negative for syncope and headaches.  Psychiatric/Behavioral: Negative for confusion. The patient is not nervous/anxious.       Allergies  Review of patient's allergies indicates no known allergies.  Home Medications   Prior to Admission medications   Medication Sig Start Date End Date Taking? Authorizing Provider  fluconazole (DIFLUCAN) 150 MG tablet Take 1 tablet (150 mg total) by mouth daily. 11/01/14   Hayes Rehfeldt Orlene OchM Canna Nickelson, NP  metroNIDAZOLE (FLAGYL) 500 MG tablet Take 1 tablet (500 mg total) by mouth 2 (two) times daily. 11/01/14   Jalynn Waddell Orlene OchM Muriel Wilber, NP  ranitidine (ZANTAC) 150 MG tablet Take 150 mg by mouth daily.    Historical Provider, MD   BP 119/90 mmHg  Pulse 73  Temp(Src) 98.7 F (37.1 C) (Oral)  Resp 18  Ht 5\' 4"  (1.626 m)  Wt 178 lb (80.74 kg)  BMI 30.54 kg/m2  SpO2 97%  LMP 10/15/2014 Physical Exam  Constitutional: She is oriented to person, place, and time. She appears well-developed and well-nourished.  HENT:  Head: Normocephalic.  Eyes: Conjunctivae and EOM are normal.  Neck: Neck supple.  Cardiovascular: Normal rate.   Pulmonary/Chest: Effort normal.  Abdominal: Soft. There is no tenderness.  Genitourinary:  External genitalia without lesions piercing noted clitoris. Watery discharge vaginal vault. No CMT, no adnexal tenderness, uterus  without palpable enlargement.   Musculoskeletal: Normal range of motion.  Neurological: She is alert and oriented to person, place, and time. No cranial nerve deficit.  Skin: Skin is warm and dry.  Psychiatric: She has a normal mood and affect. Her behavior is normal.  Nursing note and vitals reviewed.   ED Course  Procedures (including critical care time) Labs Review Results for orders placed or  performed during the hospital encounter of 11/01/14 (from the past 24 hour(s))  Pregnancy, urine     Status: None   Collection Time: 11/01/14 11:25 AM  Result Value Ref Range   Preg Test, Ur NEGATIVE NEGATIVE  Urinalysis, Routine w reflex microscopic     Status: Abnormal   Collection Time: 11/01/14 11:25 AM  Result Value Ref Range   Color, Urine YELLOW YELLOW   APPearance CLOUDY (A) CLEAR   Specific Gravity, Urine 1.027 1.005 - 1.030   pH 5.5 5.0 - 8.0   Glucose, UA NEGATIVE NEGATIVE mg/dL   Hgb urine dipstick NEGATIVE NEGATIVE   Bilirubin Urine NEGATIVE NEGATIVE   Ketones, ur NEGATIVE NEGATIVE mg/dL   Protein, ur NEGATIVE NEGATIVE mg/dL   Urobilinogen, UA 0.2 0.0 - 1.0 mg/dL   Nitrite NEGATIVE NEGATIVE   Leukocytes, UA NEGATIVE NEGATIVE  Wet prep, genital     Status: Abnormal   Collection Time: 11/01/14 12:40 PM  Result Value Ref Range   Yeast Wet Prep HPF POC MANY (A) NONE SEEN   Trich, Wet Prep NONE SEEN NONE SEEN   Clue Cells Wet Prep HPF POC MANY (A) NONE SEEN   WBC, Wet Prep HPF POC MODERATE (A) NONE SEEN     MDM  38 y.o. female with vaginal discharge with odor x 3 days. Stable for discharge without signs of PID. Will treat for BV and yeast. She will follow up with her GYN. She will return here as needed for problems. Discussed with the patient clinical findings and plan of care and all questioned fully answered.  Final diagnoses:  Bacterial vaginosis  Monilial vulvovaginitis      Janne NapoleonHope M Oberia Beaudoin, NP 11/01/14 1319  Derwood KaplanAnkit Nanavati, MD 11/02/14 [phone]

## 2014-11-02 LAB — GC/CHLAMYDIA PROBE AMP
CT Probe RNA: NEGATIVE
GC Probe RNA: NEGATIVE

## 2015-01-31 ENCOUNTER — Encounter (HOSPITAL_BASED_OUTPATIENT_CLINIC_OR_DEPARTMENT_OTHER): Payer: Self-pay | Admitting: Emergency Medicine

## 2015-01-31 ENCOUNTER — Emergency Department (HOSPITAL_BASED_OUTPATIENT_CLINIC_OR_DEPARTMENT_OTHER)
Admission: EM | Admit: 2015-01-31 | Discharge: 2015-01-31 | Disposition: A | Discharge status: Home / Self Care | Payer: Self-pay | Attending: Emergency Medicine | Admitting: Emergency Medicine

## 2015-01-31 DIAGNOSIS — Y9289 Other specified places as the place of occurrence of the external cause: Secondary | ICD-10-CM | POA: Insufficient documentation

## 2015-01-31 DIAGNOSIS — Y9389 Activity, other specified: Secondary | ICD-10-CM | POA: Insufficient documentation

## 2015-01-31 DIAGNOSIS — X58XXXA Exposure to other specified factors, initial encounter: Secondary | ICD-10-CM | POA: Insufficient documentation

## 2015-01-31 DIAGNOSIS — Z720 Tobacco use: Secondary | ICD-10-CM | POA: Insufficient documentation

## 2015-01-31 DIAGNOSIS — S46911A Strain of unspecified muscle, fascia and tendon at shoulder and upper arm level, right arm, initial encounter: Secondary | ICD-10-CM | POA: Insufficient documentation

## 2015-01-31 DIAGNOSIS — Z79899 Other long term (current) drug therapy: Secondary | ICD-10-CM | POA: Insufficient documentation

## 2015-01-31 DIAGNOSIS — Y998 Other external cause status: Secondary | ICD-10-CM | POA: Insufficient documentation

## 2015-01-31 DIAGNOSIS — S4992XA Unspecified injury of left shoulder and upper arm, initial encounter: Secondary | ICD-10-CM | POA: Insufficient documentation

## 2015-01-31 MED ORDER — METHOCARBAMOL 500 MG PO TABS: 500.0000 mg | ORAL_TABLET | Freq: Two times a day (BID) | ORAL | Status: DC

## 2015-01-31 MED ORDER — METHOCARBAMOL 500 MG PO TABS
1000.0000 mg | ORAL_TABLET | Freq: Once | ORAL | Status: AC
  Administered 2015-01-31: 1000 mg via ORAL
  Filled 2015-01-31: qty 2

## 2015-01-31 MED ORDER — NAPROXEN 500 MG PO TABS: 500.0000 mg | ORAL_TABLET | Freq: Two times a day (BID) | ORAL | Status: DC

## 2015-01-31 MED ORDER — NAPROXEN 250 MG PO TABS
500.0000 mg | ORAL_TABLET | Freq: Once | ORAL | Status: AC
  Administered 2015-01-31: 500 mg via ORAL
  Filled 2015-01-31: qty 2

## 2015-01-31 NOTE — ED Provider Notes (Signed)
CSN: [id number]     Arrival date & time 01/31/15  0223 History   First MD Initiated Contact with Patient 01/31/15 0449     Chief Complaint  Patient presents with  . Bilateral shoulder pain      (Consider location/radiation/quality/duration/timing/severity/associated sxs/prior Treatment) Patient is a 39 y.o. female presenting with shoulder pain. The history is provided by the patient.  Shoulder Pain Location:  Shoulder Shoulder location:  R shoulder Pain details:    Quality:  Aching   Severity:  Moderate   Onset quality:  Gradual   Timing:  Constant   Progression:  Waxing and waning Chronicity:  New Handedness:  Right-handed Dislocation: no   Prior injury to area:  No Relieved by:  Nothing Worsened by:  Movement Ineffective treatments:  None tried Associated symptoms: stiffness   Associated symptoms: no back pain, no fatigue, no fever, no muscle weakness, no numbness, no swelling and no tingling   Risk factors: no frequent fractures   Worse with motion and palpation  History reviewed. No pertinent past medical history. Past Surgical History  Procedure Laterality Date  . Tubal ligation    . Cholecystectomy N/A 02/28/2014    Procedure: LAPAROSCOPIC CHOLECYSTECTOMY WITH INTRAOPERATIVE CHOLANGIOGRAM;  Surgeon: Adolph Pollack, MD;  Location: [facility];  Service: General;  Laterality: N/A;   History reviewed. No pertinent family history. History  Substance Use Topics  . Smoking status: Current Some Day Smoker    Types: Cigarettes  . Smokeless tobacco: Never Used  . Alcohol Use: Yes     Comment: social   OB History    No data available     Review of Systems  Constitutional: Negative for fever and fatigue.  Respiratory: Negative for shortness of breath.   Cardiovascular: Negative for chest pain, palpitations and leg swelling.  Musculoskeletal: Positive for stiffness. Negative for back pain.  All other systems reviewed and are negative.     Allergies  Review of  patient's allergies indicates no known allergies.  Home Medications   Prior to Admission medications   Medication Sig Start Date End Date Taking? Authorizing Provider  fluconazole (DIFLUCAN) 150 MG tablet Take 1 tablet (150 mg total) by mouth daily. 11/01/14   Hope Orlene Och, NP  metroNIDAZOLE (FLAGYL) 500 MG tablet Take 1 tablet (500 mg total) by mouth 2 (two) times daily. 11/01/14   Hope Orlene Och, NP  ranitidine (ZANTAC) 150 MG tablet Take 150 mg by mouth daily.    Historical Provider, MD   BP 131/88 mmHg  Pulse 66  Temp(Src) 98.3 F (36.8 C) (Oral)  Resp 18  Ht  (1.626 m)  Wt 183 lb (83.008 kg)  BMI 31.40 kg/m2  SpO2 100%  LMP 01/31/2015 Physical Exam  Constitutional: She is oriented to person, place, and time. She appears well-developed and well-nourished. No distress.  HENT:  Head: Normocephalic and atraumatic.  Right Ear: External ear normal.  Left Ear: External ear normal.  Eyes: Conjunctivae and EOM are normal. Pupils are equal, round, and reactive to light.  Neck: Normal range of motion. Neck supple.  No tenderness midline  Cardiovascular: Normal rate, regular rhythm and intact distal pulses.   Pulmonary/Chest: Effort normal and breath sounds normal. No respiratory distress. She has no wheezes. She has no rales.  Abdominal: Soft. Bowel sounds are normal. There is no tenderness. There is no rebound and no guarding.  Musculoskeletal: Normal range of motion.       Right shoulder: She exhibits normal range of motion,  no tenderness, no bony tenderness, no swelling, no effusion, no crepitus, no deformity, no laceration, no spasm, normal pulse and normal strength.       Right elbow: Normal.She exhibits normal range of motion, no swelling, no effusion, no deformity and no laceration.       Right wrist: She exhibits normal range of motion, no tenderness, no bony tenderness, no swelling, no effusion, no crepitus, no deformity and no laceration.  Negative Neers test pain with  active motuion but intact ROM 5/5 RUE strength 3+ radial pulse BNVI RUE  Lymphadenopathy:    She has no cervical adenopathy.  Neurological: She is alert and oriented to person, place, and time. She has normal reflexes.  Skin: Skin is warm and dry.  Psychiatric: She has a normal mood and affect.    ED Course  Procedures (including critical care time) Labs Review Labs Reviewed - No data to display  Imaging Review No results found.   EKG Interpretation None      MDM   Final diagnoses:  Shoulder strain, right, initial encounter    Elevation NSAIDs and muscle relaxants follow up with your PMD for recheck    Kimarion Chery K Solina Heron-Rasch, MD 01/31/15 [phone]

## 2015-01-31 NOTE — ED Notes (Signed)
Pt states she has been having bilateral shoulder pain for about 2 weeks

## 2015-02-04 ENCOUNTER — Encounter (HOSPITAL_BASED_OUTPATIENT_CLINIC_OR_DEPARTMENT_OTHER): Payer: Self-pay

## 2015-02-04 ENCOUNTER — Emergency Department (HOSPITAL_BASED_OUTPATIENT_CLINIC_OR_DEPARTMENT_OTHER)
Admission: EM | Admit: 2015-02-04 | Discharge: 2015-02-04 | Disposition: A | Discharge status: Home / Self Care | Payer: Self-pay | Attending: Emergency Medicine | Admitting: Emergency Medicine

## 2015-02-04 DIAGNOSIS — Z720 Tobacco use: Secondary | ICD-10-CM | POA: Insufficient documentation

## 2015-02-04 DIAGNOSIS — M25512 Pain in left shoulder: Secondary | ICD-10-CM | POA: Insufficient documentation

## 2015-02-04 DIAGNOSIS — M25511 Pain in right shoulder: Secondary | ICD-10-CM

## 2015-02-04 MED ORDER — HYDROCODONE-ACETAMINOPHEN 5-325 MG PO TABS: 1.0000 | ORAL_TABLET | ORAL | Status: DC | PRN

## 2015-02-04 MED ORDER — IBUPROFEN 800 MG PO TABS: 800.0000 mg | ORAL_TABLET | Freq: Three times a day (TID) | ORAL | Status: AC

## 2015-02-04 NOTE — ED Notes (Signed)
Patient here for recheck of ongoing bilateral shoulder pain with radiation down arms, seen last week and no relief with meds

## 2015-02-04 NOTE — Discharge Instructions (Signed)
As discussed, your evaluation today has been largely reassuring.  But, it is important that you monitor your condition carefully, and do not hesitate to return to the ED if you develop new, or concerning changes in your condition. ? ?Otherwise, please follow-up with your physician for appropriate ongoing care. ? ?

## 2015-02-04 NOTE — ED Provider Notes (Signed)
CSN: [id number]     Arrival date & time 02/04/15  1053 History   First MD Initiated Contact with Patient 02/04/15 1107     Chief Complaint  Patient presents with  . Shoulder Pain     (Consider location/radiation/quality/duration/timing/severity/associated sxs/prior Treatment) HPI Patient presents with concern of ongoing bilateral shoulder pain.  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  Symptoms seem worse at night, after day of activity. Pain is anterior, nonradiating, worse with motion or palpation. There is no concurrent chest pain, dyspnea, fever, chills, loss of sensation in either upper extremity. Pain is improved transiently with previous the prescribed medication. Patient has not followed up with an orthopedist yet.  History reviewed. No pertinent past medical history. Past Surgical History  Procedure Laterality Date  . Tubal ligation    . Cholecystectomy N/A 02/28/2014    Procedure: LAPAROSCOPIC CHOLECYSTECTOMY WITH INTRAOPERATIVE CHOLANGIOGRAM;  Surgeon: Adolph Pollackodd J Rosenbower, MD;  Location: [facility];  Service: General;  Laterality: N/A;   No family history on file. History  Substance Use Topics  . Smoking status: Current Some Day Smoker    Types: Cigarettes  . Smokeless tobacco: Never Used  . Alcohol Use: Yes     Comment: social   OB History    No data available     Review of Systems  All other systems reviewed and are negative.     Allergies  Review of patient's allergies indicates no known allergies.  Home Medications   Prior to Admission medications   Medication Sig Start Date End Date Taking? Authorizing Provider  HYDROcodone-acetaminophen (NORCO/VICODIN) 5-325 MG per tablet Take 1 tablet by mouth every 4 (four) hours as needed for severe pain. 02/04/15   Gerhard Munchobert Tiffinie Caillier, MD   ibuprofen (ADVIL,MOTRIN) 800 MG tablet Take 1 tablet (800 mg total) by mouth 3 (three) times daily. 02/04/15 02/07/15  Gerhard Munchobert Nahomi Hegner, MD   BP 133/90 mmHg  Pulse 75  Temp(Src) 98.7 F (37.1 C) (Oral)  Resp 18  Wt 183 lb (83.008 kg)  SpO2 100%  LMP 01/31/2015 Physical Exam  Constitutional: She is oriented to person, place, and time. She appears well-developed and well-nourished. No distress.  HENT:  Head: Normocephalic and atraumatic.  Right Ear: External ear normal.  Left Ear: External ear normal.  Eyes: Conjunctivae and EOM are normal. Pupils are equal, round, and reactive to light.  Cardiovascular: Normal rate, regular rhythm and intact distal pulses.   Pulmonary/Chest: Effort normal and breath sounds normal. No respiratory distress. She has no wheezes. She has no rales.  Musculoskeletal: Normal range of motion.       Right shoulder: She exhibits normal range of motion, no tenderness, no bony tenderness, no swelling, no effusion, no crepitus, no deformity, no laceration, no spasm, normal pulse and normal strength.       Right elbow: Normal.She exhibits normal range of motion, no swelling, no effusion, no deformity and no laceration.       Right wrist: She exhibits normal range of motion, no tenderness, no bony tenderness, no swelling, no effusion, no crepitus, no deformity and no laceration.  Negative Neers test pain with active motuion but intact ROM 5/5 RUE strength 3+ radial pulse BNVI RUE  There is tenderness to palpation about the insertion point of the deltoid bilaterally.  Neurological: She is alert and  oriented to person, place, and time. She has normal reflexes.  Skin: Skin is warm and dry.  Psychiatric: She has a normal mood and affect.    ED Course  Procedures (including critical care time) I reviewed the patient's visit notes from last week, including documentation with diagnosis of multiple likely musculoskeletal pain.   MDM   Final diagnoses:  Bilateral  shoulder pain    Patient resents of ongoing bilateral shoulder pain.  No evidence for neurovascular compromise.  Patient's acknowledgment of starting a new job, with physical exertion suggest musculoskeletal etiology.  Patient was provided additional anti-inflammatories, ice instructions, will follow up with orthopedics for further management.    Gerhard Munch, MD 02/04/15 [phone]

## 2015-02-22 ENCOUNTER — Emergency Department (HOSPITAL_BASED_OUTPATIENT_CLINIC_OR_DEPARTMENT_OTHER)
Admission: EM | Admit: 2015-02-22 | Discharge: 2015-02-22 | Discharge status: Against Medical Advice | Payer: Self-pay | Attending: Emergency Medicine | Admitting: Emergency Medicine

## 2015-02-22 ENCOUNTER — Encounter (HOSPITAL_BASED_OUTPATIENT_CLINIC_OR_DEPARTMENT_OTHER): Payer: Self-pay | Admitting: *Deleted

## 2015-02-22 DIAGNOSIS — T507X5A Adverse effect of analeptics and opioid receptor antagonists, initial encounter: Secondary | ICD-10-CM | POA: Insufficient documentation

## 2015-02-22 DIAGNOSIS — Z720 Tobacco use: Secondary | ICD-10-CM | POA: Insufficient documentation

## 2015-02-22 DIAGNOSIS — L299 Pruritus, unspecified: Secondary | ICD-10-CM

## 2015-02-22 DIAGNOSIS — T428X5A Adverse effect of antiparkinsonism drugs and other central muscle-tone depressants, initial encounter: Secondary | ICD-10-CM | POA: Insufficient documentation

## 2015-02-22 DIAGNOSIS — L298 Other pruritus: Secondary | ICD-10-CM | POA: Insufficient documentation

## 2015-02-22 MED ORDER — DIPHENHYDRAMINE HCL 25 MG PO CAPS: 25.0000 mg | ORAL_CAPSULE | Freq: Once | ORAL | Status: DC

## 2015-02-22 MED ORDER — HYDROXYZINE HCL 10 MG PO TABS
10.0000 mg | ORAL_TABLET | Freq: Once | ORAL | Status: DC
Start: 2015-02-22 — End: 2015-02-22
  Filled 2015-02-22: qty 1

## 2015-02-22 MED ORDER — FAMOTIDINE 20 MG PO TABS
20.0000 mg | ORAL_TABLET | Freq: Once | ORAL | Status: AC
  Administered 2015-02-22: 20 mg via ORAL
  Filled 2015-02-22: qty 1

## 2015-02-22 MED ORDER — HYDROXYZINE HCL 25 MG PO TABS
25.0000 mg | ORAL_TABLET | Freq: Once | ORAL | Status: AC
  Administered 2015-02-22: 25 mg via ORAL
  Filled 2015-02-22: qty 1

## 2015-02-22 NOTE — ED Provider Notes (Signed)
CSN: [id number]     Arrival date & time 02/22/15  [id number] History   First MD Initiated Contact with Patient 02/22/15 0902     Chief Complaint  Patient presents with  . Pruritis     (Consider location/radiation/quality/duration/timing/severity/associated sxs/prior Treatment) HPI  This is a 39 year old female who presents with pruritus. Patient reports that she took 3 hydrocodone and a muscle relaxer this morning. She has previously tolerated these. However, she developed "itching all over." She denies any rash. She denies any difficulty swallowing or breathing. She took 3 Benadryl without any relief. She denies any other symptoms including chest pain, shortness breath, abdominal pain, nausea, or vomiting.  History reviewed. No pertinent past medical history. Past Surgical History  Procedure Laterality Date  . Tubal ligation    . Cholecystectomy N/A 02/28/2014    Procedure: LAPAROSCOPIC CHOLECYSTECTOMY WITH INTRAOPERATIVE CHOLANGIOGRAM;  Surgeon: Adolph Pollackodd J Rosenbower, MD;  Location: [facility];  Service: General;  Laterality: N/A;   History reviewed. No pertinent family history. History  Substance Use Topics  . Smoking status: Current Some Day Smoker    Types: Cigarettes  . Smokeless tobacco: Never Used  . Alcohol Use: Yes     Comment: social   OB History    No data available     Review of Systems  HENT: Negative for trouble swallowing.   Respiratory: Negative for cough, chest tightness and shortness of breath.   Cardiovascular: Negative for chest pain.  Gastrointestinal: Negative for nausea, vomiting and abdominal pain.  Skin: Negative for wound.  All other systems reviewed and are negative.     Allergies  Review of patient's allergies indicates no known allergies.  Home Medications   Prior to Admission medications   Medication Sig Start Date End Date Taking? Authorizing Provider  naproxen (NAPROSYN) 500 MG tablet Take 500 mg by mouth as needed.   Yes Historical Provider, MD   HYDROcodone-acetaminophen (NORCO/VICODIN) 5-325 MG per tablet Take 1 tablet by mouth every 4 (four) hours as needed for severe pain. 02/04/15   Gerhard Munchobert Lockwood, MD   BP 126/84 mmHg  Pulse 70  Temp(Src) 98.4 F (36.9 C) (Oral)  Resp 16  Ht 5\' 4"  (1.626 m)  Wt 180 lb (81.647 kg)  BMI 30.88 kg/m2  SpO2 97%  LMP 01/30/2015 Physical Exam  Constitutional: She is oriented to person, place, and time. She appears well-developed and well-nourished. No distress.  HENT:  Head: Normocephalic and atraumatic.  Mouth/Throat: Oropharynx is clear and moist.  No swelling appreciated  Neck: Neck supple.  Cardiovascular: Normal rate, regular rhythm and normal heart sounds.   No murmur heard. Pulmonary/Chest: Effort normal and breath sounds normal. No respiratory distress. She has no wheezes.  Abdominal: Soft. There is no tenderness.  Neurological: She is alert and oriented to person, place, and time.  Skin: Skin is warm and dry. No rash noted.  Psychiatric: She has a normal mood and affect.  Nursing note and vitals reviewed.   ED Course  Procedures (including critical care time) Labs Review Labs Reviewed - No data to display  Imaging Review No results found.   EKG Interpretation None      MDM   Final diagnoses:  Pruritus    Patient presents with pruritus following taking hydrocodone and Flexeril. She is nontoxic on exam. No signs of anaphylaxis. She is otherwise well-appearing without rash or difficulty breathing. She has already taken Benadryl. Patient was given Atarax and Pepcid.  10:25 am On recheck, patient continues to endorse whole-body itching.  Will re-dose Benadryl. If this does not help will consider naloxone.  10:43 AM Was just informed by nursing that patient would like to leave. She eloped without receiving formal discharge.      Shon Baton, MD 02/22/15 [phone]

## 2015-02-22 NOTE — ED Notes (Signed)
Pt states "I can go home and take a damn benadryl, if you aren't going to do anything I'm leaving". Pt dressing, dr. Wilkie AyeHorton notified, pt refuses to sign out ama and leaves ed despite encouragement to stay.

## 2015-02-22 NOTE — ED Notes (Signed)
Pt to room 3 reporting taking hydrocodone and muscle relaxer this am for her right shoulder pain, now states she is itching all over. No rash noted, pt states she took benadryl with no relief of her itching.

## 2016-03-18 ENCOUNTER — Emergency Department (HOSPITAL_BASED_OUTPATIENT_CLINIC_OR_DEPARTMENT_OTHER)
Admission: EM | Admit: 2016-03-18 | Discharge: 2016-03-19 | Disposition: A | Discharge status: Home / Self Care | Payer: Self-pay | Attending: Emergency Medicine | Admitting: Emergency Medicine

## 2016-03-18 ENCOUNTER — Encounter (HOSPITAL_BASED_OUTPATIENT_CLINIC_OR_DEPARTMENT_OTHER): Payer: Self-pay | Admitting: Emergency Medicine

## 2016-03-18 DIAGNOSIS — F17210 Nicotine dependence, cigarettes, uncomplicated: Secondary | ICD-10-CM | POA: Insufficient documentation

## 2016-03-18 DIAGNOSIS — Z711 Person with feared health complaint in whom no diagnosis is made: Secondary | ICD-10-CM

## 2016-03-18 DIAGNOSIS — Z3202 Encounter for pregnancy test, result negative: Secondary | ICD-10-CM | POA: Insufficient documentation

## 2016-03-18 DIAGNOSIS — Z202 Contact with and (suspected) exposure to infections with a predominantly sexual mode of transmission: Secondary | ICD-10-CM | POA: Insufficient documentation

## 2016-03-18 DIAGNOSIS — N390 Urinary tract infection, site not specified: Secondary | ICD-10-CM | POA: Insufficient documentation

## 2016-03-18 LAB — WET PREP, GENITAL
SPERM: NONE SEEN
Trich, Wet Prep: NONE SEEN
Yeast Wet Prep HPF POC: NONE SEEN

## 2016-03-18 LAB — URINE MICROSCOPIC-ADD ON

## 2016-03-18 LAB — URINALYSIS, ROUTINE W REFLEX MICROSCOPIC
BILIRUBIN URINE: NEGATIVE
GLUCOSE, UA: NEGATIVE mg/dL
HGB URINE DIPSTICK: NEGATIVE
KETONES UR: NEGATIVE mg/dL
Nitrite: POSITIVE — AB
PH: 6.5 (ref 5.0–8.0)
Protein, ur: NEGATIVE mg/dL
Specific Gravity, Urine: 1.021 (ref 1.005–1.030)

## 2016-03-18 LAB — PREGNANCY, URINE: Preg Test, Ur: NEGATIVE

## 2016-03-18 MED ORDER — CEFTRIAXONE SODIUM 250 MG IJ SOLR
250.0000 mg | Freq: Once | INTRAMUSCULAR | Status: AC
  Administered 2016-03-18: 250 mg via INTRAMUSCULAR
  Filled 2016-03-18: qty 250

## 2016-03-18 MED ORDER — LIDOCAINE HCL (PF) 1 % IJ SOLN
INTRAMUSCULAR | Status: AC
  Administered 2016-03-18: 1.2 mL
  Filled 2016-03-18: qty 5

## 2016-03-18 MED ORDER — AZITHROMYCIN 250 MG PO TABS
1000.0000 mg | ORAL_TABLET | Freq: Once | ORAL | Status: AC
  Administered 2016-03-18: 1000 mg via ORAL
  Filled 2016-03-18: qty 4

## 2016-03-18 MED ORDER — PHENAZOPYRIDINE HCL 200 MG PO TABS: 200.0000 mg | ORAL_TABLET | Freq: Three times a day (TID) | ORAL | Status: DC

## 2016-03-18 MED ORDER — CEPHALEXIN 500 MG PO CAPS: 500.0000 mg | ORAL_CAPSULE | Freq: Two times a day (BID) | ORAL | Status: DC

## 2016-03-18 NOTE — ED Notes (Signed)
Patient states that she is urinating foul smelling urine and sh eis having some pressure in her pelvic region

## 2016-03-18 NOTE — ED Provider Notes (Signed)
CSN: [id number]     Arrival date & time 03/18/16  2121 History  By signing my name below, I, Marisue Humble, attest that this documentation has been prepared under the direction and in the presence of Shon Baton, MD . Electronically Signed: Marisue Humble, Scribe. 03/18/2016. 11:20 PM.    Chief Complaint  Patient presents with  . Urinary Retention   The history is provided by the patient. No language interpreter was used.   HPI Comments:  Cassandra Summers is a 40 y.o. female who presents to the Emergency Department complaining of foul smelling urine since last week. Pt reports associated pelvic pressure and mild back pain today. She notes mild dysuria with urination in ED. Pt drank cranberry juice and water with no relief. She states current symptoms feel similar to prior UTI. Pt also reports unprotected sex with same partner for four years; she requests a pelvic exam today. Denies fever, vaginal discharge, major medical problems, daily medications or known medication allergies.  History reviewed. No pertinent past medical history. Past Surgical History  Procedure Laterality Date  . Tubal ligation    . Cholecystectomy N/A 02/28/2014    Procedure: LAPAROSCOPIC CHOLECYSTECTOMY WITH INTRAOPERATIVE CHOLANGIOGRAM;  Surgeon: Adolph Pollack, MD;  Location: [facility];  Service: General;  Laterality: N/A;   History reviewed. No pertinent family history. Social History  Substance Use Topics  . Smoking status: Current Some Day Smoker    Types: Cigarettes  . Smokeless tobacco: Never Used  . Alcohol Use: Yes     Comment: social   OB History    No data available     Review of Systems  Constitutional: Negative for fever.  Genitourinary: Positive for dysuria and pelvic pain. Negative for vaginal discharge.       Odorous urine   Musculoskeletal: Positive for back pain.  All other systems reviewed and are negative.  Allergies  Review of patient's allergies indicates no known  allergies.  Home Medications   Prior to Admission medications   Medication Sig Start Date End Date Taking? Authorizing Provider  HYDROcodone-acetaminophen (NORCO/VICODIN) 5-325 MG per tablet Take 1 tablet by mouth every 4 (four) hours as needed for severe pain. 02/04/15   Gerhard Munch, MD  naproxen (NAPROSYN) 500 MG tablet Take 500 mg by mouth as needed.    Historical Provider, MD   BP 149/99 mmHg  Pulse 77  Temp(Src) 99 F (37.2 C) (Oral)  Resp 20  Ht  (1.626 m)  Wt 145 lb (65.772 kg)  BMI 24.88 kg/m2  SpO2 98%  LMP 02/27/2016 Physical Exam  Constitutional: She is oriented to person, place, and time. She appears well-developed and well-nourished. No distress.  HENT:  Head: Normocephalic and atraumatic.  Cardiovascular: Normal rate, regular rhythm and normal heart sounds.   Pulmonary/Chest: Effort normal and breath sounds normal. No respiratory distress. She has no wheezes.  Abdominal: Soft. There is no tenderness.  Genitourinary:  No CVA tenderness, normal external vaginal exam, moderate white vaginal discharge  Neurological: She is alert and oriented to person, place, and time.  Skin: Skin is warm and dry.  Psychiatric: She has a normal mood and affect.  Nursing note and vitals reviewed.   ED Course  Procedures  DIAGNOSTIC STUDIES:  Oxygen Saturation is 98% on RA, normal by my interpretation.    COORDINATION OF CARE:  11:07 PM Will order UA and perform pelvic exam. Will discharge with antibiotics for UTI. Discussed treatment plan with pt at bedside and pt agreed to  plan.  Labs Review Labs Reviewed  URINALYSIS, ROUTINE W REFLEX MICROSCOPIC (NOT AT [facility]) - Abnormal; Notable for the following:    APPearance CLOUDY (*)    Nitrite POSITIVE (*)    Leukocytes, UA MODERATE (*)    All other components within normal limits  URINE MICROSCOPIC-ADD ON - Abnormal; Notable for the following:    Squamous Epithelial / LPF 0-5 (*)    Bacteria, UA MANY (*)    All other  components within normal limits  URINE CULTURE  WET PREP, GENITAL  PREGNANCY, URINE  GC/CHLAMYDIA PROBE AMP ([place]) NOT AT [facility]    Imaging Review No results found. I have personally reviewed and evaluated these images and lab results as part of my medical decision-making.   EKG Interpretation None      MDM   Final diagnoses:  UTI (lower urinary tract infection)  Concern about STD in female without diagnosis    Patient presents with concerns for UTI.  Urine is suspicious.  Culture sent.  Patient also tested and treated empirically for STDs.  Will discharge home on Keflex twice a day.  After history, exam, and medical workup I feel the patient has been appropriately medically screened and is safe for discharge home. Pertinent diagnoses were discussed with the patient. Patient was given return precautions.  I personally performed the services described in this documentation, which was scribed in my presence. The recorded information has been reviewed and is accurate.    Shon Batonourtney F Horton, MD 03/18/16 [phone]

## 2016-03-18 NOTE — ED Notes (Signed)
MD at bedside. 

## 2016-03-18 NOTE — Discharge Instructions (Signed)
Urinary Tract Infection Urinary tract infections (UTIs) can develop anywhere along your urinary tract. Your urinary tract is your body's drainage system for removing wastes and extra water. Your urinary tract includes two kidneys, two ureters, a bladder, and a urethra. Your kidneys are a pair of bean-shaped organs. Each kidney is about the size of your fist. They are located below your ribs, one on each side of your spine. CAUSES Infections are caused by microbes, which are microscopic organisms, including fungi, viruses, and bacteria. These organisms are so small that they can only be seen through a microscope. Bacteria are the microbes that most commonly cause UTIs. SYMPTOMS  Symptoms of UTIs may vary by age and gender of the patient and by the location of the infection. Symptoms in young women typically include a frequent and intense urge to urinate and a painful, burning feeling in the bladder or urethra during urination. Older women and men are more likely to be tired, shaky, and weak and have muscle aches and abdominal pain. A fever may mean the infection is in your kidneys. Other symptoms of a kidney infection include pain in your back or sides below the ribs, nausea, and vomiting. DIAGNOSIS To diagnose a UTI, your caregiver will ask you about your symptoms. Your caregiver will also ask you to provide a urine sample. The urine sample will be tested for bacteria and white blood cells. White blood cells are made by your body to help fight infection. TREATMENT  Typically, UTIs can be treated with medication. Because most UTIs are caused by a bacterial infection, they usually can be treated with the use of antibiotics. The choice of antibiotic and length of treatment depend on your symptoms and the type of bacteria causing your infection. HOME CARE INSTRUCTIONS  If you were prescribed antibiotics, take them exactly as your caregiver instructs you. Finish the medication even if you feel better after  you have only taken some of the medication.  Drink enough water and fluids to keep your urine clear or pale yellow.  Avoid caffeine, tea, and carbonated beverages. They tend to irritate your bladder.  Empty your bladder often. Avoid holding urine for long periods of time.  Empty your bladder before and after sexual intercourse.  After a bowel movement, women should cleanse from front to back. Use each tissue only once. SEEK MEDICAL CARE IF:   You have back pain.  You develop a fever.  Your symptoms do not begin to resolve within 3 days. SEEK IMMEDIATE MEDICAL CARE IF:   You have severe back pain or lower abdominal pain.  You develop chills.  You have nausea or vomiting.  You have continued burning or discomfort with urination. MAKE SURE YOU:   Understand these instructions.  Will watch your condition.  Will get help right away if you are not doing well or get worse.   This information is not intended to replace advice given to you by your health care provider. Make sure you discuss any questions you have with your health care provider.   Document Released: 09/10/2005 Document Revised: 08/22/2015 Document Reviewed: 01/09/2012 Elsevier Interactive Patient Education 2016 [employer] Sexually Transmitted Disease A sexually transmitted disease (STD) is a disease or infection that may be passed (transmitted) from person to person, usually during sexual activity. This may happen by way of saliva, semen, blood, vaginal mucus, or urine. Common STDs include:  Gonorrhea.  Chlamydia.  Syphilis.  HIV and AIDS.  Genital herpes.  Hepatitis B and C.  Trichomonas.  Human papillomavirus (HPV).  Pubic lice.  Scabies.  Mites.  Bacterial vaginosis. WHAT ARE CAUSES OF STDs? An STD may be caused by bacteria, a virus, or parasites. STDs are often transmitted during sexual activity if one person is infected. However, they may also be transmitted through nonsexual means.  STDs may be transmitted after:   Sexual intercourse with an infected person.  Sharing sex toys with an infected person.  Sharing needles with an infected person or using unclean piercing or tattoo needles.  Having intimate contact with the genitals, mouth, or rectal areas of an infected person.  Exposure to infected fluids during birth. WHAT ARE THE SIGNS AND SYMPTOMS OF STDs? Different STDs have different symptoms. Some people may not have any symptoms. If symptoms are present, they may include:  Painful or bloody urination.  Pain in the pelvis, abdomen, vagina, anus, throat, or eyes.  A skin rash, itching, or irritation.  Growths, ulcerations, blisters, or sores in the genital and anal areas.  Abnormal vaginal discharge with or without bad odor.  Penile discharge in men.  Fever.  Pain or bleeding during sexual intercourse.  Swollen glands in the groin area.  Yellow skin and eyes (jaundice). This is seen with hepatitis.  Swollen testicles.  Infertility.  Sores and blisters in the mouth. HOW ARE STDs DIAGNOSED? To make a diagnosis, your health care provider may:  Take a medical history.  Perform a physical exam.  Take a sample of any discharge to examine.  Swab the throat, cervix, opening to the penis, rectum, or vagina for testing.  Test a sample of your first morning urine.  Perform blood tests.  Perform a Pap test, if this applies.  Perform a colposcopy.  Perform a laparoscopy. HOW ARE STDs TREATED? Treatment depends on the STD. Some STDs may be treated but not cured.  Chlamydia, gonorrhea, trichomonas, and syphilis can be cured with antibiotic medicine.  Genital herpes, hepatitis, and HIV can be treated, but not cured, with prescribed medicines. The medicines lessen symptoms.  Genital warts from HPV can be treated with medicine or by freezing, burning (electrocautery), or surgery. Warts may come back.  HPV cannot be cured with medicine or  surgery. However, abnormal areas may be removed from the cervix, vagina, or vulva.  If your diagnosis is confirmed, your recent sexual partners need treatment. This is true even if they are symptom-free or have a negative culture or evaluation. They should not have sex until their health care providers say it is okay.  Your health care provider may test you for infection again 3 months after treatment. HOW CAN I REDUCE MY RISK OF GETTING AN STD? Take these steps to reduce your risk of getting an STD:  Use latex condoms, dental dams, and water-soluble lubricants during sexual activity. Do not use petroleum jelly or oils.  Avoid having multiple sex partners.  Do not have sex with someone who has other sex partners  Do not have sex with anyone you do not know or who is at high risk for an STD.  Avoid risky sex practices that can break your skin.  Do not have sex if you have open sores on your mouth or skin.  Avoid drinking too much alcohol or taking illegal drugs. Alcohol and drugs can affect your judgment and put you in a vulnerable position.  Avoid engaging in oral and anal sex acts.  Get vaccinated for HPV and hepatitis. If you have not received these vaccines in the past,  talk to your health care provider about whether one or both might be right for you.  If you are at risk of being infected with HIV, it is recommended that you take a prescription medicine daily to prevent HIV infection. This is called pre-exposure prophylaxis (PrEP). You are considered at risk if:  You are a man who has sex with other men (MSM).  You are a heterosexual man or woman and are sexually active with more than one partner.  You take drugs by injection.  You are sexually active with a partner who has HIV.  Talk with your health care provider about whether you are at high risk of being infected with HIV. If you choose to begin PrEP, you should first be tested for HIV. You should then be tested every 3  months for as long as you are taking PrEP. WHAT SHOULD I DO IF I THINK I HAVE AN STD?  See your health care provider.  Tell your sexual partner(s). They should be tested and treated for any STDs.  Do not have sex until your health care provider says it is okay. WHEN SHOULD I GET IMMEDIATE MEDICAL CARE? Contact your health care provider right away if:   You have severe abdominal pain.  You are a man and notice swelling or pain in your testicles.  You are a woman and notice swelling or pain in your vagina.   This information is not intended to replace advice given to you by your health care provider. Make sure you discuss any questions you have with your health care provider.   Document Released: 02/21/2003 Document Revised: 12/22/2014 Document Reviewed: 06/21/2013 Elsevier Interactive Patient Education [employer]

## 2016-03-19 LAB — GC/CHLAMYDIA PROBE AMP (~~LOC~~) NOT AT ARMC
CHLAMYDIA, DNA PROBE: NEGATIVE
NEISSERIA GONORRHEA: NEGATIVE

## 2016-03-21 LAB — URINE CULTURE: Culture: >=100000 — AB

## 2016-03-22 ENCOUNTER — Telehealth: Payer: Self-pay

## 2016-03-22 NOTE — Telephone Encounter (Signed)
Post ED Visit - Positive Culture Follow-up  Culture report reviewed by antimicrobial stewardship pharmacist:  []  Enzo BiNathan Batchelder, Pharm.D. []  Celedonio MiyamotoJeremy Frens, Pharm.D., BCPS []  Garvin FilaMike Maccia, Pharm.D. []  Georgina PillionElizabeth Martin, Pharm.D., BCPS []  Delavan LakeMinh Pham, [address] BoulevardPharm.D., BCPS, AAHIVP []  Estella HuskMichelle Turner, Pharm.D., BCPS, AAHIVP []  Cassie Stewart, Pharm.D. []  Sherle Poeob Vincent, [place]arm[place]D. Pollyann SamplesAndy Johnston PharmD Positive urine culture Treated with Cefazolin, organism sensitive to the same and no further patient follow-up is required at this time.  Jerry CarasCullom, Marie Chow Burnett 03/22/2016, 10:35 AM

## 2018-09-09 ENCOUNTER — Encounter (HOSPITAL_BASED_OUTPATIENT_CLINIC_OR_DEPARTMENT_OTHER): Payer: Self-pay | Admitting: *Deleted

## 2018-09-09 ENCOUNTER — Other Ambulatory Visit: Payer: Self-pay

## 2018-09-09 ENCOUNTER — Emergency Department (HOSPITAL_BASED_OUTPATIENT_CLINIC_OR_DEPARTMENT_OTHER)
Admission: EM | Admit: 2018-09-09 | Discharge: 2018-09-09 | Disposition: A | Discharge status: Home / Self Care | Payer: Self-pay | Attending: Emergency Medicine | Admitting: Emergency Medicine

## 2018-09-09 DIAGNOSIS — R42 Dizziness and giddiness: Secondary | ICD-10-CM | POA: Insufficient documentation

## 2018-09-09 DIAGNOSIS — F17210 Nicotine dependence, cigarettes, uncomplicated: Secondary | ICD-10-CM | POA: Insufficient documentation

## 2018-09-09 DIAGNOSIS — E860 Dehydration: Secondary | ICD-10-CM | POA: Insufficient documentation

## 2018-09-09 NOTE — Discharge Instructions (Signed)
Drink plenty of fluids over the next day.  Eat and drink well for the next 48 hours, no dieting.  Return for worsening symptoms, chest pain, shortness of breath, inability to walk

## 2018-09-09 NOTE — ED Provider Notes (Signed)
[facility] EMERGENCY DEPARTMENT Provider Note   CSN: [id number] Arrival date & time: 09/09/18  1328     History   Chief Complaint Chief Complaint  Patient presents with  . Dizziness    HPI Cassandra Summers is a 42 y.o. female.  42 yo F with a chief complaint of lightheadedness.  This started a few hours ago while she was at work.  She states that she was sitting for most the day as her desk and got up to go see her boss and felt swimmy headed as she walks there.  She felt like her right arm was different than her left and so she left and came here.  She denies chest pain or shortness of breath denies headache or neck pain.  Denies vomiting or diarrhea.  She denies cough congestion or fever.  She states she has had some allergies acting up on her for the past week or so.  She denies likelihood of being pregnant.  She does have a history of iron deficiency anemia and is been taking iron off and on.  She feels a lot better currently.  Head movement does not seem to make this worse.  It is worse upon standing.  She has not been drinking much today due to restrictions at work.  The history is provided by the patient.  Illness  This is a new problem. The current episode started 1 to 2 hours ago. The problem occurs constantly. The problem has not changed since onset.Pertinent negatives include no chest pain, no abdominal pain, no headaches and no shortness of breath. Nothing aggravates the symptoms. Nothing relieves the symptoms. She has tried nothing for the symptoms. The treatment provided no relief.    History reviewed. No pertinent past medical history.  Patient Active Problem List   Diagnosis Date Noted  . Cholelithiasis and cholecystitis without obstruction 02/27/2014    Past Surgical History:  Procedure Laterality Date  . CHOLECYSTECTOMY N/A 02/28/2014   Procedure: LAPAROSCOPIC CHOLECYSTECTOMY WITH INTRAOPERATIVE CHOLANGIOGRAM;  Surgeon: Adolph Pollack, MD;  Location:  [facility];  Service: General;  Laterality: N/A;  . TUBAL LIGATION       OB History   None      Home Medications    Prior to Admission medications   Medication Sig Start Date End Date Taking? Authorizing Provider  naproxen (NAPROSYN) 500 MG tablet Take 500 mg by mouth as needed.    [provider]  phenazopyridine (PYRIDIUM) 200 MG tablet Take 1 tablet (200 mg total) by mouth 3 (three) times daily with meals. 03/18/16   Horton, Mayer Masker, MD  ranitidine (ZANTAC) 150 MG tablet Take 150 mg by mouth daily.  02/04/15  [provider]    Family History History reviewed. No pertinent family history.  Social History Social History   Tobacco Use  . Smoking status: Current Some Day Smoker    Types: Cigarettes  . Smokeless tobacco: Never Used  Substance Use Topics  . Alcohol use: Yes    Comment: social  . Drug use: No     Allergies   Patient has no known allergies.   Review of Systems Review of Systems  Constitutional: Negative for chills and fever.  HENT: Negative for congestion and rhinorrhea.   Eyes: Negative for redness and visual disturbance.  Respiratory: Negative for shortness of breath and wheezing.   Cardiovascular: Negative for chest pain and palpitations.  Gastrointestinal: Negative for abdominal pain, nausea and vomiting.  Genitourinary: Negative for dysuria and  urgency.  Musculoskeletal: Negative for arthralgias and myalgias.  Skin: Negative for pallor and wound.  Neurological: Positive for light-headedness. Negative for dizziness and headaches.     Physical Exam Updated Vital Signs BP 119/84 (BP Location: Right Arm)   Pulse (!) 53   Temp 98.6 F (37 C) (Oral)   Resp 16   Ht 5\' 4"  (1.626 m)   Wt 63.5 kg   LMP 09/02/2018   SpO2 100%   BMI 24.03 kg/m   Physical Exam  Constitutional: She is oriented to person, place, and time. She appears well-developed and well-nourished. No distress.  HENT:  Head: Normocephalic and atraumatic.    Erythema to the posterior oropharynx.  She has an effusion to the left TM without erythema or bulging.  Swollen turbinates.  Eyes: Pupils are equal, round, and reactive to light. EOM are normal.  Neck: Normal range of motion. Neck supple.  Cardiovascular: Normal rate and regular rhythm. Exam reveals no gallop and no friction rub.  No murmur heard. Pulmonary/Chest: Effort normal. She has no wheezes. She has no rales.  Abdominal: Soft. She exhibits no distension. There is no tenderness.  Musculoskeletal: She exhibits no edema or tenderness.  Neurological: She is alert and oriented to person, place, and time. She has normal strength. No cranial nerve deficit or sensory deficit. She displays a negative Romberg sign. Coordination and gait normal. GCS eye subscore is 4. GCS verbal subscore is 5. GCS motor subscore is 6. She displays no Babinski's sign on the right side. She displays no Babinski's sign on the left side.  Skin: Skin is warm and dry. She is not diaphoretic.  Psychiatric: She has a normal mood and affect. Her behavior is normal.  Nursing note and vitals reviewed.    ED Treatments / Results  Labs (all labs ordered are listed, but only abnormal results are displayed) Labs Reviewed - No data to display  EKG None  Radiology No results found.  Procedures Procedures (including critical care time)  Medications Ordered in ED Medications - No data to display   Initial Impression / Assessment and Plan / ED Course  I have reviewed the triage vital signs and the nursing notes.  Pertinent labs & imaging results that were available during my care of the patient were reviewed by me and considered in my medical decision making (see chart for details).     42 yo F with a chief complaint of lightheadedness.  This appears to be worse upon standing.  I feel this is more likely orthostatic.  She has no dizziness with head movement she has no nystagmus on exam.  She is able to turn her  head back and forth without recurrence of her symptoms.  She does have signs of URI.  I offered to do lab work including a pregnancy test and an EKG which she is declined.  She would like to go home and will drink plenty fluids.  She will call her family doctor in the morning.   3:50 PM:  I have discussed the diagnosis/risks/treatment options with the patient and believe the pt to be eligible for discharge home to follow-up with PCP. We also discussed returning to the ED immediately if new or worsening sx occur. We discussed the sx which are most concerning (e.g., sudden worsening pain, fever, inability to tolerate by mouth) that necessitate immediate return. Medications administered to the patient during their visit and any new prescriptions provided to the patient are listed below.  Medications given during  this visit Medications - No data to display    The patient appears reasonably screen and/or stabilized for discharge and I doubt any other medical condition or other [facility] requiring further screening, evaluation, or treatment in the ED at this time prior to discharge.     Final Clinical Impressions(s) / ED Diagnoses   Final diagnoses:  Dizziness  Lightheadedness  Dehydration    ED Discharge Orders    None       Melene Plan, DO 09/09/18 1550

## 2018-09-09 NOTE — ED Triage Notes (Signed)
Pt c/o sudden onset of dizziness , denies h/a x 2 hrs

## 2020-02-29 ENCOUNTER — Telehealth: Payer: Self-pay | Admitting: *Deleted

## 2020-02-29 NOTE — Telephone Encounter (Signed)
Received referral from Dr Cherly Hensen to get patient in [facility] with Dr Oneal Deputy to call patient, no voicemail set up

## 2020-03-02 NOTE — Telephone Encounter (Signed)
Spoke with patient and she was at PT, stated she would call back

## 2020-03-28 ENCOUNTER — Ambulatory Visit: Payer: BC Managed Care – PPO | Admitting: Cardiovascular Disease

## 2020-03-28 NOTE — Telephone Encounter (Signed)
Patient scheduled for 4/20.  

## 2020-04-03 ENCOUNTER — Ambulatory Visit: Payer: BC Managed Care – PPO | Admitting: Cardiovascular Disease

## 2020-04-06 ENCOUNTER — Telehealth: Payer: Self-pay | Admitting: Cardiovascular Disease

## 2020-04-06 NOTE — Telephone Encounter (Signed)
   Went to chart to check who called pt 

## 2020-04-11 ENCOUNTER — Other Ambulatory Visit: Payer: Self-pay

## 2020-04-11 ENCOUNTER — Encounter: Payer: Self-pay | Admitting: Cardiovascular Disease

## 2020-04-11 ENCOUNTER — Ambulatory Visit (INDEPENDENT_AMBULATORY_CARE_PROVIDER_SITE_OTHER): Payer: BLUE CROSS/BLUE SHIELD | Admitting: Cardiovascular Disease

## 2020-04-11 DIAGNOSIS — I10 Essential (primary) hypertension: Secondary | ICD-10-CM

## 2020-04-11 HISTORY — DX: Essential (primary) hypertension: I10

## 2020-04-11 NOTE — Patient Instructions (Addendum)
Medication Instructions:  Your physician recommends that you continue on your current medications as directed. Please refer to the Current Medication list given to you today.   Labwork: NONE   Testing/Procedures: NONE   Follow-Up: Your physician recommends that you schedule a follow-up appointment in: PHARM D 1 MONTH 05/17/2020 AT 2:30 PM    You will receive a phone call from the PREP exercise and nutrition program to schedule an initial assessment.  Special Instructions:   MONITOR YOUR BLOOD PRESSURE TWICE A DAY, LOG IN BOOK PROVIDED   TRY TO EXERCISE 150 MINUTES EACH WEEK   DASH Eating Plan DASH stands for "Dietary Approaches to Stop Hypertension." The DASH eating plan is a healthy eating plan that has been shown to reduce high blood pressure (hypertension). It may also reduce your risk for type 2 diabetes, heart disease, and stroke. The DASH eating plan may also help with weight loss. What are tips for following this plan?  General guidelines  Avoid eating more than 2,300 mg (milligrams) of salt (sodium) a day. If you have hypertension, you may need to reduce your sodium intake to 1,500 mg a day.  Limit alcohol intake to no more than 1 drink a day for nonpregnant women and 2 drinks a day for men. One drink equals 12 oz of beer, 5 oz of wine, or 1 oz of hard liquor.  Work with your health care provider to maintain a healthy body weight or to lose weight. Ask what an ideal weight is for you.  Get at least 30 minutes of exercise that causes your heart to beat faster (aerobic exercise) most days of the week. Activities may include walking, swimming, or biking.  Work with your health care provider or diet and nutrition specialist (dietitian) to adjust your eating plan to your individual calorie needs. Reading food labels   Check food labels for the amount of sodium per serving. Choose foods with less than 5 percent of the Daily Value of sodium. Generally, foods with less than 300  mg of sodium per serving fit into this eating plan.  To find whole grains, look for the word "whole" as the first word in the ingredient list. Shopping  Buy products labeled as "low-sodium" or "no salt added."  Buy fresh foods. Avoid canned foods and premade or frozen meals. Cooking  Avoid adding salt when cooking. Use salt-free seasonings or herbs instead of table salt or sea salt. Check with your health care provider or pharmacist before using salt substitutes.  Do not fry foods. Cook foods using healthy methods such as baking, boiling, grilling, and broiling instead.  Cook with heart-healthy oils, such as olive, canola, soybean, or sunflower oil. Meal planning  Eat a balanced diet that includes: ? 5 or more servings of fruits and vegetables each day. At each meal, try to fill half of your plate with fruits and vegetables. ? Up to 6-8 servings of whole grains each day. ? Less than 6 oz of lean meat, poultry, or fish each day. A 3-oz serving of meat is about the same size as a deck of cards. One egg equals 1 oz. ? 2 servings of low-fat dairy each day. ? A serving of nuts, seeds, or beans 5 times each week. ? Heart-healthy fats. Healthy fats called Omega-3 fatty acids are found in foods such as flaxseeds and coldwater fish, like sardines, salmon, and mackerel.  Limit how much you eat of the following: ? Canned or prepackaged foods. ? Food that is  high in trans fat, such as fried foods. ? Food that is high in saturated fat, such as fatty meat. ? Sweets, desserts, sugary drinks, and other foods with added sugar. ? Full-fat dairy products.  Do not salt foods before eating.  Try to eat at least 2 vegetarian meals each week.  Eat more home-cooked food and less restaurant, buffet, and fast food.  When eating at a restaurant, ask that your food be prepared with less salt or no salt, if possible. What foods are recommended? The items listed may not be a complete list. Talk with your  dietitian about what dietary choices are best for you. Grains Whole-grain or whole-wheat bread. Whole-grain or whole-wheat pasta. Brown rice. [place]ena Morrow. Bulgur. Whole-grain and low-sodium cereals. Pita bread. Low-fat, low-sodium crackers. Whole-wheat flour tortillas. Vegetables Fresh or frozen vegetables (raw, steamed, roasted, or grilled). Low-sodium or reduced-sodium tomato and vegetable juice. Low-sodium or reduced-sodium tomato sauce and tomato paste. Low-sodium or reduced-sodium canned vegetables. Fruits All fresh, dried, or frozen fruit. Canned fruit in natural juice (without added sugar). Meat and other protein foods Skinless chicken or [place]. Ground chicken or [place]. Pork with fat trimmed off. Fish and seafood. Egg whites. Dried beans, peas, or lentils. Unsalted nuts, nut butters, and seeds. Unsalted canned beans. Lean cuts of beef with fat trimmed off. Low-sodium, lean deli meat. Dairy Low-fat (1%) or fat-free (skim) milk. Fat-free, low-fat, or reduced-fat cheeses. Nonfat, low-sodium ricotta or cottage cheese. Low-fat or nonfat yogurt. Low-fat, low-sodium cheese. Fats and oils Soft margarine without trans fats. Vegetable oil. Low-fat, reduced-fat, or light mayonnaise and salad dressings (reduced-sodium). Canola, safflower, olive, soybean, and sunflower oils. Avocado. Seasoning and other foods Herbs. Spices. Seasoning mixes without salt. Unsalted popcorn and pretzels. Fat-free sweets. What foods are not recommended? The items listed may not be a complete list. Talk with your dietitian about what dietary choices are best for you. Grains Baked goods made with fat, such as croissants, muffins, or some breads. Dry pasta or rice meal packs. Vegetables Creamed or fried vegetables. Vegetables in a cheese sauce. Regular canned vegetables (not low-sodium or reduced-sodium). Regular canned tomato sauce and paste (not low-sodium or reduced-sodium). Regular tomato and vegetable juice (not  low-sodium or reduced-sodium). Angie Fava. Olives. Fruits Canned fruit in a light or heavy syrup. Fried fruit. Fruit in cream or butter sauce. Meat and other protein foods Fatty cuts of meat. Ribs. Fried meat. Berniece Salines. Sausage. Bologna and other processed lunch meats. Salami. Fatback. Hotdogs. Bratwurst. Salted nuts and seeds. Canned beans with added salt. Canned or smoked fish. Whole eggs or egg yolks. Chicken or [place] with skin. Dairy Whole or 2% milk, cream, and half-and-half. Whole or full-fat cream cheese. Whole-fat or sweetened yogurt. Full-fat cheese. Nondairy creamers. Whipped toppings. Processed cheese and cheese spreads. Fats and oils Butter. Stick margarine. Lard. Shortening. Ghee. Bacon fat. Tropical oils, such as coconut, palm kernel, or palm oil. Seasoning and other foods Salted popcorn and pretzels. Onion salt, garlic salt, seasoned salt, table salt, and sea salt. Worcestershire sauce. Tartar sauce. Barbecue sauce. Teriyaki sauce. Soy sauce, including reduced-sodium. Steak sauce. Canned and packaged gravies. Fish sauce. Oyster sauce. Cocktail sauce. Horseradish that you find on the shelf. Ketchup. Mustard. Meat flavorings and tenderizers. Bouillon cubes. Hot sauce and Tabasco sauce. Premade or packaged marinades. Premade or packaged taco seasonings. Relishes. Regular salad dressings. Where to find more information:  National Heart, Lung, and [place]: https://wilson-eaton.com/  American Heart Association: www.heart.org Summary  The DASH eating plan is a healthy eating  plan that has been shown to reduce high blood pressure (hypertension). It may also reduce your risk for type 2 diabetes, heart disease, and stroke.  With the DASH eating plan, you should limit salt (sodium) intake to 2,300 mg a day. If you have hypertension, you may need to reduce your sodium intake to 1,500 mg a day.  When on the DASH eating plan, aim to eat more fresh fruits and vegetables, whole grains, lean proteins,  low-fat dairy, and heart-healthy fats.  Work with your health care provider or diet and nutrition specialist (dietitian) to adjust your eating plan to your individual calorie needs. This information is not intended to replace advice given to you by your health care provider. Make sure you discuss any questions you have with your health care provider. Document Released: 11/20/2011 Document Revised: 11/13/2017 Document Reviewed: 11/24/2016 Elsevier Patient Education  2020 [employer].

## 2020-04-11 NOTE — Addendum Note (Signed)
Addended by: Chilton Si C on: 04/11/2020 05:29 PM   Modules accepted: Orders

## 2020-04-11 NOTE — Progress Notes (Signed)
Hypertension Clinic Initial Assessment:    Date:  04/11/2020   ID:  Cassandra Summers, DOB 06-Aug-1976, MRN [id number]  PCP:  Patient, No Pcp Per  Cardiologist:  No primary care provider on file.  Nephrologist:  Referring MD: Cassandra Better, MD   CC: Hypertension  History of Present Illness:    Cassandra Summers is a 44 y.o. female with a hx of hypertension here to establish care in the hypertension clinic.  She has known that her BP was high for about 10 years.  She wasn't getting medical care due to lack of insurance.  She tried to control it with diet and exercise.  She stopped eating meat in order to lower her sodium intake.  She was also exercising by doing yoga or walking on the treadmill.  In the past she was doing this twice per week.  Lately she has been doing it much.  She had an injury at work where a 100 pound box landed on her head.  Since then she has not been feeling well.  She denies any exertional chest pain or shortness of breath.  She denies lower extremity edema, orthopnea, or PND.  She has been feeling stressed at times.  She has occasional palpitations that last for a few seconds at a time.  There is no associated shortness of breath or lightheadedness.  She has been eating beets and apple cider vinegar to help with her blood pressure.  She does not use any other supplements or over-the-counter medications.  She drinks alcohol rarely and does not drink caffeine.  She does not snore and feels rested when she awakens in the morning.   Previous antihypertensives: none   Past Medical History:  Diagnosis Date  . Essential hypertension 04/11/2020    Past Surgical History:  Procedure Laterality Date  . CHOLECYSTECTOMY N/A 02/28/2014   Procedure: LAPAROSCOPIC CHOLECYSTECTOMY WITH INTRAOPERATIVE CHOLANGIOGRAM;  Surgeon: Cassandra Pollack, MD;  Location: [facility];  Service: General;  Laterality: N/A;  . TUBAL LIGATION      Current Medications: Current Meds  Medication  Sig  . nystatin-triamcinolone ointment (MYCOLOG) APPLY TOPICALLY TO THE AFFECTED AREA TWICE DAILY  . ondansetron (ZOFRAN) 4 MG tablet Take by mouth.  . rizatriptan (MAXALT) 5 MG tablet Take by mouth.  Marland Kitchen tiZANidine (ZANAFLEX) 4 MG tablet Take 4 mg by mouth 3 (three) times daily.     Allergies:   Codeine   Social History   Socioeconomic History  . Marital status: Single    Spouse name: Not on file  . Number of children: Not on file  . Years of education: Not on file  . Highest education level: Not on file  Occupational History  . Not on file  Tobacco Use  . Smoking status: Current Some Day Smoker    Types: Cigarettes  . Smokeless tobacco: Never Used  Substance and Sexual Activity  . Alcohol use: Yes    Comment: social  . Drug use: No  . Sexual activity: Yes    Birth control/protection: Surgical, None  Other Topics Concern  . Not on file  Social History Narrative  . Not on file   Social Determinants of Health   Financial Resource Strain:   . Difficulty of Paying Living Expenses:   Food Insecurity:   . Worried About Programme researcher, broadcasting/film/video in the Last Year:   . Barista in the Last Year:   Transportation Needs:   . Freight forwarder (Medical):   Marland Kitchen  Lack of Transportation (Non-Medical):   Physical Activity:   . Days of Exercise per Week:   . Minutes of Exercise per Session:   Stress:   . Feeling of Stress :   Social Connections:   . Frequency of Communication with Friends and Family:   . Frequency of Social Gatherings with Friends and Family:   . Attends Religious Services:   . Active Member of Clubs or Organizations:   . Attends Banker Meetings:   Marland Kitchen Marital Status:      Family History: The patient's family history includes CAD (age of onset: 9) in her father; Heart attack in her maternal grandmother; Hypertension in her father, maternal grandfather, maternal grandmother, and mother.  ROS:   Please see the history of present illness.      All other systems reviewed and are negative.  EKGs/Labs/Other Studies Reviewed:    EKG:  EKG is ordered today.  The ekg ordered today demonstrates sinus bradycardia.  Rate 56 bpm.  Recent Labs: No results found for requested labs within last 8760 hours.   Recent Lipid Panel No results found for: CHOL, TRIG, HDL, CHOLHDL, VLDL, LDLCALC, LDLDIRECT  Physical Exam:    VS:  BP 136/82   Pulse (!) 56   Ht 5\' 4"  (1.626 m)   Wt 163 lb 3.2 oz (74 kg)   SpO2 99%   BMI 28.01 kg/m  , BMI Body mass index is 28.01 kg/m. GENERAL:  Well appearing HEENT: Pupils equal round and reactive, fundi not visualized, oral mucosa unremarkable NECK:  No jugular venous distention, waveform within normal limits, carotid upstroke brisk and symmetric, no bruits, no thyromegaly LYMPHATICS:  No cervical adenopathy LUNGS:  Clear to auscultation bilaterally HEART:  RRR.  PMI not displaced or sustained,S1 and S2 within normal limits, no S3, no S4, no clicks, no rubs, no murmurs ABD:  Flat, positive bowel sounds normal in frequency in pitch, no bruits, no rebound, no guarding, no midline pulsatile mass, no hepatomegaly, no splenomegaly EXT:  2 plus pulses throughout, no edema, no cyanosis no clubbing SKIN:  No rashes no nodules NEURO:  Cranial nerves II through XII grossly intact, motor grossly intact throughout PSYCH:  Cognitively intact, oriented to person place and time   ASSESSMENT:    1. Essential hypertension     PLAN:    # Elevated blood pressure:  BP was much Summers on repeat.  She is quite anxious that something is wrong with her heart.  On repeat her blood pressure was much Summers.  For now we will have her check her blood pressure at home twice daily and bring it to follow-up.  She will work on limiting her sodium intake to under 2500 mg daily and increase her exercise to 150 minutes weekly.  She is interested in enrolling in our PREP program through the [facility].  She will follow up with our pharmacist  in 1 month.  She was given information about the DASH diet. Consider checking lipids, CMP and TSH at follow up with PharmD.  This was not addressed today.   Disposition:    FU with MD/PharmD in 1 month and Jeyren Danowski C. [facility], MD, [facility] in 4 months.   Medication Adjustments/Labs and Tests Ordered: Current medicines are reviewed at length with the patient today.  Concerns regarding medicines are outlined above.  Orders Placed This Encounter  Procedures  . EKG 12-Lead   No orders of the defined types were placed in this encounter.    Signed,  Skeet Latch, MD  04/11/2020 5:26 PM    [place]

## 2020-04-12 ENCOUNTER — Telehealth: Payer: Self-pay | Admitting: Licensed Clinical Social Worker

## 2020-04-12 NOTE — Progress Notes (Signed)
Heart and Vascular Care Navigation  04/12/2020  Cassandra Summers 02-27-1976 [id number]  Reason for Referral: Patient referred for housing options.                                                                                                    Assessment:  Patient reports having some housing issues in the past and she and her daughter "joined forces" and obtained housing together as individually they were both struggling to find suitable housing.  Patient recently started a new position at the [employer] and reports "I am very proud of myself for this opportunity". Patient states that her daughter's boyfriend likes to "do extra curricular activities" and she doesn't want to take any chances living in the same home.   Patient requesting assistance with options for housing and resources for security deposit as she is limited with finances. Patient denies any immediate danger or threat in current housing.                              HRT/VAS Care Coordination    Patients Home Cardiology Office  [place] Team  Social Worker   Social Worker Name:  Alexis Frock [phone]   Lives with:  Adult Children   Patient Current Insurance Coverage  Commercial Insurance   Patient Has Concern With [place]  No   Does Patient Have Prescription Coverage?  Yes   Home Assistive Devices/Equipment  None      Social History:                                                                             SDOH Screenings   Alcohol Screen:   . Last Alcohol Screening Score (AUDIT):   Depression (PHQ2-9):   . PHQ-2 Score:   Financial Resource Strain:   . Difficulty of Paying Living Expenses:   Food Insecurity:   . Worried About Charity fundraiser in the Last Year:   . [place] in the Last Year:   Housing: Medium Risk  . Last Housing Risk Score: 1  Physical Activity:   . Days of Exercise per Week:   . Minutes of Exercise per Session:   Social  Connections:   . Frequency of Communication with Friends and Family:   . Frequency of Social Gatherings with Friends and Family:   . Attends Religious Services:   . Active Member of Clubs or Organizations:   . Attends Archivist Meetings:   Marland Kitchen Marital Status:   Stress:   . Feeling of Stress :   Tobacco Use: High Risk  . Smoking Tobacco Use: Current Some Day Smoker  . Smokeless Tobacco Use: Never Used  Transportation Needs: No  Transportation Needs  . Lack of Transportation (Medical): No  . Lack of Transportation (Non-Medical): No    SDOH Interventions: Financial Resources:    CSW will assist patient with financial resources for security deposit once hosuing is obtained.  Food Insecurity:   N/a  Housing Insecurity:  Housing Interventions: Other (Comment)(Refrred to [employer] for housing options.)  Transportation:    n/a    Follow-up plan:  Patient verbalizes understanding of follow up with [employer] web site and to return call to CSW for further assistance once she has located an appropriate place to live. Patient grateful for the information and support. CSW will continue to follow for financial resources and support as needed. Lasandra Beech, LCSW, CCSW-MCS [phone]

## 2020-04-13 ENCOUNTER — Telehealth: Payer: Self-pay

## 2020-04-13 NOTE — Telephone Encounter (Signed)
Attempted to reach patient via hm phone number. Unable to leave message due to mailbox not being set up. Will retry at a later date.

## 2020-04-25 ENCOUNTER — Telehealth: Payer: Self-pay

## 2020-04-25 NOTE — Telephone Encounter (Signed)
Call placed to patient reference PREP referral-vm not set up  Patient phones right back. Is interested in PREP and can do T/Th daytime class 1p-215p Explained structure of class  Given address to Y so she can GPS it since she is not familiar with area. Will be in contact with pt to schedule 1:1 intake interview in next couple weeks

## 2020-05-03 ENCOUNTER — Telehealth: Payer: Self-pay

## 2020-05-03 NOTE — Telephone Encounter (Signed)
Call placed to patient reference PREP Class start on 6/8 every Tues/Thurs 1-215pm. Intake appt scheduled for 5/27 at 1pm. Emailed information/address to patient per her request.

## 2020-05-10 NOTE — Progress Notes (Signed)
Pt sts will need to call back to schedule intake. Unsure of job schedule currently.

## 2020-05-17 ENCOUNTER — Ambulatory Visit: Payer: BLUE CROSS/BLUE SHIELD

## 2020-05-17 NOTE — Progress Notes (Deleted)
Patient ID: Solaris Kram                 DOB: 28-Aug-1976                      MRN: [id number]     HPI: Adeena Bernabe is a 44 y.o. female referred by Dr.  to HTN clinic.  Current HTN meds:  Previously tried:  BP goal:   Family History:   Social History:   Diet:   Exercise:   Home BP readings:   Wt Readings from Last 3 Encounters:  04/11/20 163 lb 3.2 oz (74 kg)  09/09/18 140 lb (63.5 kg)  03/18/16 145 lb (65.8 kg)   BP Readings from Last 3 Encounters:  04/11/20 136/82  09/09/18 119/84  03/19/16 129/94   Pulse Readings from Last 3 Encounters:  04/11/20 (!) 56  09/09/18 (!) 53  03/19/16 83    Renal function: CrCl cannot be calculated (Patient's most recent lab result is older than the maximum 21 days allowed.).  Past Medical History:  Diagnosis Date  . Essential hypertension 04/11/2020    Current Outpatient Medications on File Prior to Visit  Medication Sig Dispense Refill  . nystatin-triamcinolone ointment (MYCOLOG) APPLY TOPICALLY TO THE AFFECTED AREA TWICE DAILY    . ondansetron (ZOFRAN) 4 MG tablet Take by mouth.    . rizatriptan (MAXALT) 5 MG tablet Take by mouth.    Marland Kitchen tiZANidine (ZANAFLEX) 4 MG tablet Take 4 mg by mouth 3 (three) times daily.     No current facility-administered medications on file prior to visit.    Allergies  Allergen Reactions  . Codeine Itching    There were no vitals taken for this visit.  No problem-specific Assessment & Plan notes found for this encounter.    Damel Querry Rodriguez-Guzman PharmD, BCPS, CPP [facility] [address] [place] [address] 05/17/2020 2:35 PM

## 2020-12-28 ENCOUNTER — Encounter (HOSPITAL_BASED_OUTPATIENT_CLINIC_OR_DEPARTMENT_OTHER): Payer: Self-pay

## 2020-12-28 ENCOUNTER — Other Ambulatory Visit: Payer: Self-pay

## 2020-12-28 ENCOUNTER — Emergency Department (HOSPITAL_BASED_OUTPATIENT_CLINIC_OR_DEPARTMENT_OTHER)
Admission: EM | Admit: 2020-12-28 | Discharge: 2020-12-28 | Disposition: A | Discharge status: Home / Self Care | Payer: HRSA Program | Attending: Emergency Medicine | Admitting: Emergency Medicine

## 2020-12-28 DIAGNOSIS — U071 COVID-19: Secondary | ICD-10-CM | POA: Diagnosis not present

## 2020-12-28 DIAGNOSIS — J069 Acute upper respiratory infection, unspecified: Secondary | ICD-10-CM | POA: Diagnosis not present

## 2020-12-28 DIAGNOSIS — F17210 Nicotine dependence, cigarettes, uncomplicated: Secondary | ICD-10-CM | POA: Insufficient documentation

## 2020-12-28 DIAGNOSIS — Z20822 Contact with and (suspected) exposure to covid-19: Secondary | ICD-10-CM

## 2020-12-28 DIAGNOSIS — I10 Essential (primary) hypertension: Secondary | ICD-10-CM | POA: Insufficient documentation

## 2020-12-28 DIAGNOSIS — R059 Cough, unspecified: Secondary | ICD-10-CM | POA: Diagnosis present

## 2020-12-28 MED ORDER — BENZONATATE 100 MG PO CAPS
100.0000 mg | ORAL_CAPSULE | Freq: Three times a day (TID) | ORAL | 0 refills | Status: AC
Start: 2020-12-28 — End: ?

## 2020-12-28 MED ORDER — IBUPROFEN 200 MG PO TABS
600.0000 mg | ORAL_TABLET | Freq: Once | ORAL | Status: AC
  Administered 2020-12-28: 600 mg via ORAL
  Filled 2020-12-28: qty 1

## 2020-12-28 MED ORDER — FLUTICASONE PROPIONATE 50 MCG/ACT NA SUSP: 1.0000 | Freq: Every day | NASAL | 2 refills | Status: AC

## 2020-12-28 MED ORDER — ACETAMINOPHEN 325 MG PO TABS
650.0000 mg | ORAL_TABLET | Freq: Once | ORAL | Status: AC
  Administered 2020-12-28: 650 mg via ORAL
  Filled 2020-12-28: qty 2

## 2020-12-28 MED ORDER — NAPROXEN 500 MG PO TABS
500.0000 mg | ORAL_TABLET | Freq: Two times a day (BID) | ORAL | 0 refills | Status: AC
Start: 2020-12-28 — End: ?

## 2020-12-28 NOTE — Discharge Instructions (Signed)
Take medications as needed. Follow-up with your primary care provider or the 1 listed below. We will contact you with the results of your COVID test by tomorrow evening latest if it is positive. Make sure you are drinking plenty of fluids to prevent dehydration. Return to the ER if you start to experience chest pain, shortness of breath, persistent vomiting or severe abdominal pain.

## 2020-12-28 NOTE — ED Triage Notes (Signed)
Pt c/o flu like sx x 1 week-NAD-steady gait 

## 2020-12-28 NOTE — ED Provider Notes (Signed)
[facility] EMERGENCY DEPARTMENT Provider Note   CSN: [id number] Arrival date & time: 12/28/20  1322     History Chief Complaint  Patient presents with  . Cough    Cassandra Summers is a 45 y.o. female with a past medical history of hypertension presenting to the ED with a chief complaint of cough, body aches, headache, ear pain for the past week.  Reports sick contacts at work with similar symptoms.  She has tried teas, elderberry syrup with only minimal improvement in her symptoms.  States that she was around a coworker that was COVID-positive but states that she had symptoms prior to this.  She initially thought it was "just a cold" but her usual home remedies were not helping.  Denies any chest pain, diarrhea, vomiting but does report a "queasiness" in her abdomen that started today.  No shortness of breath or productive cough  HPI     Past Medical History:  Diagnosis Date  . Essential hypertension 04/11/2020    Patient Active Problem List   Diagnosis Date Noted  . Essential hypertension 04/11/2020  . Cholelithiasis and cholecystitis without obstruction 02/27/2014    Past Surgical History:  Procedure Laterality Date  . CHOLECYSTECTOMY N/A 02/28/2014   Procedure: LAPAROSCOPIC CHOLECYSTECTOMY WITH INTRAOPERATIVE CHOLANGIOGRAM;  Surgeon: Adolph Pollack, MD;  Location: [facility];  Service: General;  Laterality: N/A;  . TUBAL LIGATION       OB History   No obstetric history on file.     Family History  Problem Relation Age of Onset  . Hypertension Mother   . Hypertension Father   . CAD Father 19  . Hypertension Maternal Grandmother   . Heart attack Maternal Grandmother   . Hypertension Maternal Grandfather     Social History   Tobacco Use  . Smoking status: Current Some Day Smoker    Types: Cigarettes  . Smokeless tobacco: Never Used  Substance Use Topics  . Alcohol use: Yes    Comment: social  . Drug use: No    Home Medications Prior to  Admission medications   Medication Sig Start Date End Date Taking? Authorizing Provider  benzonatate (TESSALON) 100 MG capsule Take 1 capsule (100 mg total) by mouth every 8 (eight) hours. 12/28/20  Yes Bambie Pizzolato, PA-C  fluticasone (FLONASE) 50 MCG/ACT nasal spray Place 1 spray into both nostrils daily. 12/28/20  Yes Vraj Denardo, PA-C  naproxen (NAPROSYN) 500 MG tablet Take 1 tablet (500 mg total) by mouth 2 (two) times daily. 12/28/20  Yes Basma Buchner, PA-C  nystatin-triamcinolone ointment (MYCOLOG) APPLY TOPICALLY TO THE AFFECTED AREA TWICE DAILY 04/05/20   [provider]  ondansetron (ZOFRAN) 4 MG tablet Take by mouth. 04/05/20   [provider]  rizatriptan (MAXALT) 5 MG tablet Take by mouth. 04/05/20   [provider]  tiZANidine (ZANAFLEX) 4 MG tablet Take 4 mg by mouth 3 (three) times daily. 04/05/20   [provider]    Allergies    Codeine  Review of Systems   Review of Systems  Constitutional: Positive for chills and fatigue. Negative for fever.  HENT: Positive for ear pain.   Respiratory: Positive for cough.   Cardiovascular: Negative for chest pain.  Gastrointestinal: Negative for abdominal pain and vomiting.  Musculoskeletal: Positive for myalgias.  Neurological: Positive for headaches.    Physical Exam Updated Vital Signs BP 128/75 (BP Location: Left Arm)   Pulse 64   Temp 98.6 F (37 C) (Oral)   Resp 18  Ht 5\' 4"  (1.626 m)   Wt 72.1 kg   LMP 12/14/2020   SpO2 100%   BMI 27.29 kg/m   Physical Exam Vitals and nursing note reviewed.  Constitutional:      General: She is not in acute distress.    Appearance: She is well-developed and well-nourished.  HENT:     Head: Normocephalic and atraumatic.     Right Ear: Tympanic membrane normal.     Left Ear: Tympanic membrane normal.     Nose: Nose normal.     Mouth/Throat:     Pharynx: Oropharynx is clear.  Eyes:     General: No scleral icterus.       Left eye: No discharge.      Extraocular Movements: EOM normal.     Conjunctiva/sclera: Conjunctivae normal.  Cardiovascular:     Rate and Rhythm: Normal rate and regular rhythm.     Pulses: Intact distal pulses.     Heart sounds: Normal heart sounds. No murmur heard. No friction rub. No gallop.   Pulmonary:     Effort: Pulmonary effort is normal. No respiratory distress.     Breath sounds: Normal breath sounds.  Abdominal:     General: Bowel sounds are normal. There is no distension.     Palpations: Abdomen is soft.     Tenderness: There is no abdominal tenderness. There is no guarding.  Musculoskeletal:        General: No edema. Normal range of motion.     Cervical back: Normal range of motion and neck supple.  Skin:    General: Skin is warm and dry.     Findings: No rash.  Neurological:     Mental Status: She is alert and oriented to person, place, and time.     Cranial Nerves: No cranial nerve deficit.     Motor: No abnormal muscle tone.     Coordination: Coordination normal.  Psychiatric:        Mood and Affect: Mood and affect normal.     ED Results / Procedures / Treatments   Labs (all labs ordered are listed, but only abnormal results are displayed) Labs Reviewed  SARS CORONAVIRUS 2 (TAT 6-24 HRS)    EKG None  Radiology No results found.  Procedures Procedures (including critical care time)  Medications Ordered in ED Medications  ibuprofen (ADVIL) tablet 600 mg (has no administration in time range)  acetaminophen (TYLENOL) tablet 650 mg (has no administration in time range)    ED Course  I have reviewed the triage vital signs and the nursing notes.  Pertinent labs & imaging results that were available during my care of the patient were reviewed by me and considered in my medical decision making (see chart for details).    MDM Rules/Calculators/A&P                          Cassandra Summers was evaluated in Emergency Department on 12/28/20 for the symptoms described in the  history of present illness. He/she was evaluated in the context of the global COVID-19 pandemic, which necessitated consideration that the patient might be at risk for infection with the SARS-CoV-2 virus that causes COVID-19. Institutional protocols and algorithms that pertain to the evaluation of patients at risk for COVID-19 are in a state of rapid change based on information released by regulatory bodies including the CDC and federal and state organizations. These policies and algorithms were followed during the patient's care  in the ED.  45 year old female presenting to the ED for 1 week history of viral URI symptoms including cough, body aches, ear pain, headache.  Denies any shortness of breath, chest pain, vomiting or diarrhea.  Posterior oropharynx is clear.  Bilateral TMs without any abnormalities.  Lungs are clear to auscultation bilaterally.  Vital signs within normal limits.  Patient treated here with Tylenol and ibuprofen.  Will treat symptomatically at home for viral URI and will obtain COVID test.  We will have her follow-up with these results and return for worsening symptoms   Patient is hemodynamically stable, in NAD, and able to ambulate in the ED. Evaluation does not show pathology that would require ongoing emergent intervention or inpatient treatment. I explained the diagnosis to the patient. Pain has been managed and has no complaints prior to discharge. Patient is comfortable with above plan and is stable for discharge at this time. All questions were answered prior to disposition. Strict return precautions for returning to the ED were discussed. Encouraged follow up with PCP.   An After Visit Summary was printed and given to the patient.   Portions of this note were generated with Scientist, clinical (histocompatibility and immunogenetics). Dictation errors may occur despite best attempts at proofreading.  Final Clinical Impression(s) / ED Diagnoses Final diagnoses:  Viral URI with cough  Exposure to COVID-19  virus    Rx / DC Orders ED Discharge Orders         Ordered    benzonatate (TESSALON) 100 MG capsule  Every 8 hours        12/28/20 1637    naproxen (NAPROSYN) 500 MG tablet  2 times daily        12/28/20 1637    fluticasone (FLONASE) 50 MCG/ACT nasal spray  Daily        12/28/20 1637           Dietrich Pates, PA-C 12/28/20 1638    Horton, Clabe Seal, DO 12/28/20 1703

## 2020-12-29 LAB — SARS CORONAVIRUS 2 (TAT 6-24 HRS): SARS Coronavirus 2: POSITIVE — AB

## 2020-12-31 ENCOUNTER — Telehealth: Payer: Self-pay | Admitting: *Deleted

## 2020-12-31 NOTE — Telephone Encounter (Signed)
Called to discuss with patient about COVID-19 symptoms and the use of one of the available treatments for those with mild to moderate Covid symptoms and at a high risk of hospitalization.  Pt appears to qualify for outpatient treatment due to co-morbid conditions and/or a member of an at-risk group in accordance with the FDA Emergency Use Authorization.    Symptom onset:greater than 10 days ago Vaccinated:no Booster? no Immunocompromised?  Qualifiers:   Unable to reach pt - Does not qualify.  Generated text to patient for new access to MyChart as requested by the patient.  Karsten Fells

## 2021-01-02 ENCOUNTER — Telehealth (HOSPITAL_COMMUNITY): Payer: Self-pay

## 2022-01-04 ENCOUNTER — Emergency Department (HOSPITAL_BASED_OUTPATIENT_CLINIC_OR_DEPARTMENT_OTHER): Payer: No Typology Code available for payment source

## 2022-01-04 ENCOUNTER — Other Ambulatory Visit: Payer: Self-pay

## 2022-01-04 ENCOUNTER — Emergency Department (HOSPITAL_BASED_OUTPATIENT_CLINIC_OR_DEPARTMENT_OTHER)
Admission: EM | Admit: 2022-01-04 | Discharge: 2022-01-04 | Disposition: A | Discharge status: Home / Self Care | Payer: No Typology Code available for payment source | Attending: Emergency Medicine | Admitting: Emergency Medicine

## 2022-01-04 ENCOUNTER — Encounter (HOSPITAL_BASED_OUTPATIENT_CLINIC_OR_DEPARTMENT_OTHER): Payer: Self-pay

## 2022-01-04 DIAGNOSIS — Z79899 Other long term (current) drug therapy: Secondary | ICD-10-CM | POA: Diagnosis not present

## 2022-01-04 DIAGNOSIS — I10 Essential (primary) hypertension: Secondary | ICD-10-CM | POA: Insufficient documentation

## 2022-01-04 DIAGNOSIS — S161XXA Strain of muscle, fascia and tendon at neck level, initial encounter: Secondary | ICD-10-CM | POA: Diagnosis not present

## 2022-01-04 DIAGNOSIS — S8002XA Contusion of left knee, initial encounter: Secondary | ICD-10-CM | POA: Diagnosis not present

## 2022-01-04 DIAGNOSIS — R52 Pain, unspecified: Secondary | ICD-10-CM

## 2022-01-04 DIAGNOSIS — S80912A Unspecified superficial injury of left knee, initial encounter: Secondary | ICD-10-CM | POA: Diagnosis present

## 2022-01-04 MED ORDER — IBUPROFEN 600 MG PO TABS: 600.0000 mg | ORAL_TABLET | Freq: Four times a day (QID) | ORAL | 0 refills | Status: AC | PRN

## 2022-01-04 MED ORDER — METHOCARBAMOL 500 MG PO TABS
500.0000 mg | ORAL_TABLET | Freq: Once | ORAL | Status: AC
  Administered 2022-01-04: 500 mg via ORAL
  Filled 2022-01-04: qty 1

## 2022-01-04 MED ORDER — METHOCARBAMOL 500 MG PO TABS: 500.0000 mg | ORAL_TABLET | Freq: Two times a day (BID) | ORAL | 0 refills | Status: AC

## 2022-01-04 NOTE — ED Notes (Signed)
Patient transported to X-ray 
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
## 2023-01-17 ENCOUNTER — Other Ambulatory Visit: Payer: Self-pay

## 2023-01-17 ENCOUNTER — Encounter (HOSPITAL_BASED_OUTPATIENT_CLINIC_OR_DEPARTMENT_OTHER): Payer: Self-pay

## 2023-01-17 ENCOUNTER — Emergency Department (HOSPITAL_BASED_OUTPATIENT_CLINIC_OR_DEPARTMENT_OTHER)
Admission: EM | Admit: 2023-01-17 | Discharge: 2023-01-17 | Discharge status: Against Medical Advice | Payer: BC Managed Care – PPO | Attending: Emergency Medicine | Admitting: Emergency Medicine

## 2023-01-17 DIAGNOSIS — Z5321 Procedure and treatment not carried out due to patient leaving prior to being seen by health care provider: Secondary | ICD-10-CM | POA: Insufficient documentation

## 2023-01-17 DIAGNOSIS — R509 Fever, unspecified: Secondary | ICD-10-CM | POA: Insufficient documentation

## 2023-01-17 DIAGNOSIS — R109 Unspecified abdominal pain: Secondary | ICD-10-CM | POA: Insufficient documentation

## 2023-01-17 DIAGNOSIS — R110 Nausea: Secondary | ICD-10-CM | POA: Insufficient documentation

## 2023-01-17 DIAGNOSIS — Z20822 Contact with and (suspected) exposure to covid-19: Secondary | ICD-10-CM | POA: Diagnosis not present

## 2023-01-17 DIAGNOSIS — R002 Palpitations: Secondary | ICD-10-CM | POA: Diagnosis not present

## 2023-01-17 LAB — MAGNESIUM: Magnesium: 2 mg/dL (ref 1.7–2.4)

## 2023-01-17 LAB — COMPREHENSIVE METABOLIC PANEL
ALT: 16 U/L (ref 0–44)
AST: 19 U/L (ref 15–41)
Albumin: 4.1 g/dL (ref 3.5–5.0)
Alkaline Phosphatase: 53 U/L (ref 38–126)
Anion gap: 6 (ref 5–15)
BUN: 11 mg/dL (ref 6–20)
CO2: 23 mmol/L (ref 22–32)
Calcium: 9.1 mg/dL (ref 8.9–10.3)
Chloride: 103 mmol/L (ref 98–111)
Creatinine, Ser: 0.61 mg/dL (ref 0.44–1.00)
GFR, Estimated: >60 mL/min (ref >60)
Glucose, Bld: 93 mg/dL (ref 70–99)
Potassium: 3.4 mmol/L — ABNORMAL LOW (ref 3.5–5.1)
Sodium: 132 mmol/L — ABNORMAL LOW (ref 135–145)
Total Bilirubin: 0.8 mg/dL (ref 0.3–1.2)
Total Protein: 8.3 g/dL — ABNORMAL HIGH (ref 6.5–8.1)

## 2023-01-17 LAB — LIPASE, BLOOD: Lipase: 22 U/L (ref 11–51)

## 2023-01-17 LAB — URINALYSIS, ROUTINE W REFLEX MICROSCOPIC
Bilirubin Urine: NEGATIVE
Glucose, UA: NEGATIVE mg/dL
Hgb urine dipstick: NEGATIVE
Ketones, ur: >=80 mg/dL — AB
Leukocytes,Ua: NEGATIVE
Nitrite: NEGATIVE
Protein, ur: NEGATIVE mg/dL
Specific Gravity, Urine: 1.025 (ref 1.005–1.030)
pH: 6 (ref 5.0–8.0)

## 2023-01-17 LAB — CBC
HCT: 37.6 % (ref 36.0–46.0)
Hemoglobin: 12.8 g/dL (ref 12.0–15.0)
MCH: 30.6 pg (ref 26.0–34.0)
MCHC: 34 g/dL (ref 30.0–36.0)
MCV: 90 fL (ref 80.0–100.0)
Platelets: 354 10*3/uL (ref 150–400)
RBC: 4.18 MIL/uL (ref 3.87–5.11)
RDW: 13.2 % (ref 11.5–15.5)
WBC: 5.3 10*3/uL (ref 4.0–10.5)
nRBC: 0 % (ref 0.0–0.2)

## 2023-01-17 LAB — PREGNANCY, URINE: Preg Test, Ur: NEGATIVE

## 2023-01-17 LAB — RESP PANEL BY RT-PCR (RSV, FLU A&B, COVID)  RVPGX2
Influenza A by PCR: NEGATIVE
Influenza B by PCR: NEGATIVE
Resp Syncytial Virus by PCR: NEGATIVE
SARS Coronavirus 2 by RT PCR: NEGATIVE

## 2023-01-17 LAB — TROPONIN I (HIGH SENSITIVITY): Troponin I (High Sensitivity): 4 ng/L (ref <18)

## 2023-01-17 MED ORDER — ONDANSETRON HCL 4 MG/2ML IJ SOLN
INTRAMUSCULAR | Status: AC
  Filled 2023-01-17: qty 2

## 2023-01-17 MED ORDER — ONDANSETRON HCL 4 MG/2ML IJ SOLN
4.0000 mg | Freq: Once | INTRAMUSCULAR | Status: AC
  Administered 2023-01-17: 4 mg via INTRAVENOUS

## 2023-01-17 NOTE — ED Triage Notes (Signed)
Pt reports abd pain and nausea, fever, and chills onset yesterday. She reports hx of h pylori and states this feels similar. She also reports she is producing excess saliva in her mouth. She also reports heart palpitations.
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
## 2023-01-17 NOTE — ED Notes (Signed)
Pt reports she thinks she has H pylori. Had it before and it feels the same

## 2023-01-17 NOTE — ED Notes (Addendum)
Pt requesting IV be removed. PT voiced dissatisfaction that she was not promptly attended to.  RN and charge at bedside to explain there was a medical emergency. Rn asked that she stay and allow [place] to provide care. She states "no, you didn't want to provide care earlier." RN let her know that under no circumstances did staff not want to provide care. She left at this time.

## 2023-01-18 ENCOUNTER — Emergency Department (HOSPITAL_COMMUNITY)
Admission: EM | Admit: 2023-01-18 | Discharge: 2023-01-18 | Disposition: A | Discharge status: Home / Self Care | Payer: BC Managed Care – PPO | Attending: Emergency Medicine | Admitting: Emergency Medicine

## 2023-01-18 ENCOUNTER — Encounter (HOSPITAL_COMMUNITY): Payer: Self-pay | Admitting: Emergency Medicine

## 2023-01-18 DIAGNOSIS — N390 Urinary tract infection, site not specified: Secondary | ICD-10-CM | POA: Diagnosis not present

## 2023-01-18 DIAGNOSIS — K2900 Acute gastritis without bleeding: Secondary | ICD-10-CM | POA: Diagnosis not present

## 2023-01-18 DIAGNOSIS — R1013 Epigastric pain: Secondary | ICD-10-CM | POA: Diagnosis present

## 2023-01-18 DIAGNOSIS — I10 Essential (primary) hypertension: Secondary | ICD-10-CM | POA: Insufficient documentation

## 2023-01-18 DIAGNOSIS — Z79899 Other long term (current) drug therapy: Secondary | ICD-10-CM | POA: Insufficient documentation

## 2023-01-18 LAB — CBC WITH DIFFERENTIAL/PLATELET
Abs Immature Granulocytes: 0.01 10*3/uL (ref 0.00–0.07)
Basophils Absolute: 0 10*3/uL (ref 0.0–0.1)
Basophils Relative: 1 %
Eosinophils Absolute: 0 10*3/uL (ref 0.0–0.5)
Eosinophils Relative: 1 %
HCT: 39 % (ref 36.0–46.0)
Hemoglobin: 12.8 g/dL (ref 12.0–15.0)
Immature Granulocytes: 0 %
Lymphocytes Relative: 37 %
Lymphs Abs: 1.9 10*3/uL (ref 0.7–4.0)
MCH: 30.4 pg (ref 26.0–34.0)
MCHC: 32.8 g/dL (ref 30.0–36.0)
MCV: 92.6 fL (ref 80.0–100.0)
Monocytes Absolute: 0.4 10*3/uL (ref 0.1–1.0)
Monocytes Relative: 8 %
Neutro Abs: 2.7 10*3/uL (ref 1.7–7.7)
Neutrophils Relative %: 53 %
Platelets: 356 10*3/uL (ref 150–400)
RBC: 4.21 MIL/uL (ref 3.87–5.11)
RDW: 13.2 % (ref 11.5–15.5)
WBC: 5 10*3/uL (ref 4.0–10.5)
nRBC: 0 % (ref 0.0–0.2)

## 2023-01-18 LAB — I-STAT BETA HCG BLOOD, ED (MC, WL, AP ONLY): I-stat hCG, quantitative: <5 m[IU]/mL (ref <5)

## 2023-01-18 LAB — COMPREHENSIVE METABOLIC PANEL
ALT: 15 U/L (ref 0–44)
AST: 16 U/L (ref 15–41)
Albumin: 3.9 g/dL (ref 3.5–5.0)
Alkaline Phosphatase: 50 U/L (ref 38–126)
Anion gap: 9 (ref 5–15)
BUN: 9 mg/dL (ref 6–20)
CO2: 24 mmol/L (ref 22–32)
Calcium: 9.6 mg/dL (ref 8.9–10.3)
Chloride: 100 mmol/L (ref 98–111)
Creatinine, Ser: 0.73 mg/dL (ref 0.44–1.00)
GFR, Estimated: >60 mL/min (ref >60)
Glucose, Bld: 82 mg/dL (ref 70–99)
Potassium: 3.4 mmol/L — ABNORMAL LOW (ref 3.5–5.1)
Sodium: 133 mmol/L — ABNORMAL LOW (ref 135–145)
Total Bilirubin: 0.7 mg/dL (ref 0.3–1.2)
Total Protein: 7.9 g/dL (ref 6.5–8.1)

## 2023-01-18 LAB — URINALYSIS, ROUTINE W REFLEX MICROSCOPIC
Glucose, UA: NEGATIVE mg/dL
Ketones, ur: >80 mg/dL — AB
Nitrite: NEGATIVE
Protein, ur: NEGATIVE mg/dL
Specific Gravity, Urine: 1.025 (ref 1.005–1.030)
pH: 5.5 (ref 5.0–8.0)

## 2023-01-18 LAB — URINALYSIS, MICROSCOPIC (REFLEX)

## 2023-01-18 LAB — LIPASE, BLOOD: Lipase: 27 U/L (ref 11–51)

## 2023-01-18 MED ORDER — FENTANYL CITRATE PF 50 MCG/ML IJ SOSY
50.0000 ug | PREFILLED_SYRINGE | Freq: Once | INTRAMUSCULAR | Status: AC
  Administered 2023-01-18: 50 ug via INTRAVENOUS
  Filled 2023-01-18: qty 1

## 2023-01-18 MED ORDER — SUCRALFATE 1 GM/10ML PO SUSP: 1.0000 g | Freq: Three times a day (TID) | ORAL | 0 refills | Status: AC

## 2023-01-18 MED ORDER — CEPHALEXIN 500 MG PO CAPS: 500.0000 mg | ORAL_CAPSULE | Freq: Three times a day (TID) | ORAL | 0 refills | Status: AC

## 2023-01-18 MED ORDER — ONDANSETRON 4 MG PO TBDP: 4.0000 mg | ORAL_TABLET | Freq: Three times a day (TID) | ORAL | 0 refills | Status: AC | PRN

## 2023-01-18 MED ORDER — LIDOCAINE VISCOUS HCL 2 % MT SOLN
15.0000 mL | Freq: Once | OROMUCOSAL | Status: AC
  Administered 2023-01-18: 15 mL via ORAL
  Filled 2023-01-18: qty 15

## 2023-01-18 MED ORDER — SODIUM CHLORIDE 0.9 % IV SOLN
INTRAVENOUS | Status: DC
  Administered 2023-01-18: 1000 mL via INTRAVENOUS

## 2023-01-18 MED ORDER — ALUM & MAG HYDROXIDE-SIMETH 200-200-20 MG/5ML PO SUSP
30.0000 mL | Freq: Once | ORAL | Status: AC
  Administered 2023-01-18: 30 mL via ORAL
  Filled 2023-01-18: qty 30

## 2023-01-18 MED ORDER — ONDANSETRON HCL 4 MG/2ML IJ SOLN
4.0000 mg | Freq: Once | INTRAMUSCULAR | Status: AC
  Administered 2023-01-18: 4 mg via INTRAVENOUS
  Filled 2023-01-18: qty 2

## 2023-01-18 MED ORDER — SODIUM CHLORIDE 0.9 % IV BOLUS
1000.0000 mL | Freq: Once | INTRAVENOUS | Status: AC
  Administered 2023-01-18: 1000 mL via INTRAVENOUS

## 2023-01-18 NOTE — ED Triage Notes (Signed)
PT reports sharp abdominal pains epigastric going radiating into back. Hx of H Pylori. She endorses chills as well. ABD pain x 3 days. Loss of appetite. Pt was seen at ED yesterday with labs.

## 2023-01-18 NOTE — ED Provider Notes (Signed)
[place] EMERGENCY DEPARTMENT AT [facility] Provider Note  CSN: [id number] Arrival date & time: 01/18/23 0406  Chief Complaint(s) Abdominal Pain  HPI Cassandra Summers is a 47 y.o. female    The history is provided by the patient.  Abdominal Pain Pain location:  Epigastric Pain quality: aching, sharp and stabbing   Pain radiates to:  Back Pain severity:  Moderate Onset quality:  Gradual Duration: several weeks. Timing:  Constant Progression:  Waxing and waning Chronicity:  Recurrent Context comment:  H/o H. Pylori Relieved by: Black walnut and [place] herbs. Worsened by:  Eating Associated symptoms: constipation, nausea and vomiting   Associated symptoms: no chest pain, no fever and no shortness of breath     Past Medical History Past Medical History:  Diagnosis Date   Essential hypertension 04/11/2020   Patient Active Problem List   Diagnosis Date Noted   Essential hypertension 04/11/2020   Cholelithiasis and cholecystitis without obstruction 02/27/2014   Home Medication(s) Prior to Admission medications   Medication Sig Start Date End Date Taking? Authorizing Provider  cephALEXin (KEFLEX) 500 MG capsule Take 1 capsule (500 mg total) by mouth 3 (three) times daily for 7 days. 01/18/23 01/25/23 Yes Kaydan Wilhoite, Amadeo Garnet, MD  ondansetron (ZOFRAN-ODT) 4 MG disintegrating tablet Take 1 tablet (4 mg total) by mouth every 8 (eight) hours as needed for up to 3 days for nausea or vomiting. 01/18/23 01/21/23 Yes Katilynn Sinkler, Amadeo Garnet, MD  sucralfate (CARAFATE) 1 GM/10ML suspension Take 10 mLs (1 g total) by mouth 4 (four) times daily -  with meals and at bedtime. 01/18/23  Yes Stacey Maura, Amadeo Garnet, MD  benzonatate (TESSALON) 100 MG capsule Take 1 capsule (100 mg total) by mouth every 8 (eight) hours. 12/28/20   Khatri, Hina, PA-C  fluticasone (FLONASE) 50 MCG/ACT nasal spray Place 1 spray into both nostrils daily. 12/28/20   Khatri, Hina, PA-C  ibuprofen (ADVIL) 600  MG tablet Take 1 tablet (600 mg total) by mouth every 6 (six) hours as needed. 01/04/22   Jacalyn Lefevre, MD  methocarbamol (ROBAXIN) 500 MG tablet Take 1 tablet (500 mg total) by mouth 2 (two) times daily. 01/04/22   Jacalyn Lefevre, MD  naproxen (NAPROSYN) 500 MG tablet Take 1 tablet (500 mg total) by mouth 2 (two) times daily. 12/28/20   Khatri, Hina, PA-C  nystatin-triamcinolone ointment (MYCOLOG) APPLY TOPICALLY TO THE AFFECTED AREA TWICE DAILY 04/05/20   [provider]  ondansetron (ZOFRAN) 4 MG tablet Take by mouth. 04/05/20   [provider]  rizatriptan (MAXALT) 5 MG tablet Take by mouth. 04/05/20   [provider]  tiZANidine (ZANAFLEX) 4 MG tablet Take 4 mg by mouth 3 (three) times daily. 04/05/20   [provider]  Allergies Codeine and Amitriptyline  Review of Systems Review of Systems  Constitutional:  Negative for fever.  Respiratory:  Negative for shortness of breath.   Cardiovascular:  Negative for chest pain.  Gastrointestinal:  Positive for abdominal pain, constipation, nausea and vomiting.  Genitourinary:  Positive for frequency.   As noted in HPI  Physical Exam Vital Signs  I have reviewed the triage vital signs BP 125/79   Pulse (!) 59   Temp 98.5 F (36.9 C)   Resp 17   LMP 01/10/2023 (Approximate)   SpO2 99%   Physical Exam Vitals reviewed.  Constitutional:      General: She is not in acute distress.    Appearance: She is well-developed. She is not diaphoretic.  HENT:     Head: Normocephalic and atraumatic.     Right Ear: External ear normal.     Left Ear: External ear normal.     Nose: Nose normal.  Eyes:     General: No scleral icterus.    Conjunctiva/sclera: Conjunctivae normal.  Neck:     Trachea: Phonation normal.  Cardiovascular:     Rate and Rhythm: Normal rate and regular rhythm.   Pulmonary:     Effort: Pulmonary effort is normal. No respiratory distress.     Breath sounds: No stridor.  Abdominal:     General: There is no distension.     Tenderness: There is abdominal tenderness in the epigastric area.  Musculoskeletal:        General: Normal range of motion.     Cervical back: Normal range of motion.  Neurological:     Mental Status: She is alert and oriented to person, place, and time.  Psychiatric:        Behavior: Behavior normal.     ED Results and Treatments Labs (all labs ordered are listed, but only abnormal results are displayed) Labs Reviewed  COMPREHENSIVE METABOLIC PANEL - Abnormal; Notable for the following components:      Result Value   Sodium 133 (*)    Potassium 3.4 (*)    All other components within normal limits  URINALYSIS, ROUTINE W REFLEX MICROSCOPIC - Abnormal; Notable for the following components:   APPearance CLOUDY (*)    Hgb urine dipstick TRACE (*)    Bilirubin Urine SMALL (*)    Ketones, ur >80 (*)    Leukocytes,Ua MODERATE (*)    All other components within normal limits  URINALYSIS, MICROSCOPIC (REFLEX) - Abnormal; Notable for the following components:   Bacteria, UA MANY (*)    All other components within normal limits  LIPASE, BLOOD  CBC WITH DIFFERENTIAL/PLATELET  I-STAT BETA HCG BLOOD, ED ([facility], [facility], [facility] ONLY)                                                                                                                         EKG  EKG Interpretation  Date/Time:  Sunday January 18 2023 04:37:44 EST Ventricular Rate:  62 PR Interval:  137 QRS Duration: 92 QT Interval:  413 QTC Calculation: 420 R Axis:   59 Text Interpretation: Sinus rhythm No significant change was found Confirmed by Addison Lank [id number]) on 01/18/2023 5:15:20 AM       Radiology No results found.  Medications Ordered in ED Medications  sodium chloride 0.9 % bolus 1,000 mL (1,000 mLs Intravenous New Bag/Given 01/18/23 0501)    And  0.9  %  sodium chloride infusion (1,000 mLs Intravenous New Bag/Given 01/18/23 0539)  fentaNYL (SUBLIMAZE) injection 50 mcg (50 mcg Intravenous Given 01/18/23 0501)  ondansetron (ZOFRAN) injection 4 mg (4 mg Intravenous Given 01/18/23 0501)  alum & mag hydroxide-simeth (MAALOX/MYLANTA) 200-200-20 MG/5ML suspension 30 mL (30 mLs Oral Given 01/18/23 0540)    And  lidocaine (XYLOCAINE) 2 % viscous mouth solution 15 mL (15 mLs Oral Given 01/18/23 0540)                                                                                                                                     Procedures Procedures  (including critical care time)  Medical Decision Making / ED Course   Medical Decision Making Amount and/or Complexity of Data Reviewed Labs: ordered. Decision-making details documented in ED Course. ECG/medicine tests: ordered and independent interpretation performed. Decision-making details documented in ED Course.  Risk OTC drugs. Prescription drug management.    Epigastric abdominal discomfort.  History of gastritis.    Differential includes but not limited to gastritis, pancreatitis,  in situ biliary stone.  Will also assess for other intra-abdominal inflammatory/infectious processes but I have a lower suspicion for this.  Given her urinary symptoms, will assess for urinary tract infection.  Exam is not concerning for pyelonephritis at this time.  hCG obtained to assist with guiding care.  CBC without leukocytosis.  No anemia. Metabolic panel without significant electrolyte derangements or renal sufficiency.  No evidence of bili obstruction pancreatitis. hCG negative.      Final Clinical Impression(s) / ED Diagnoses Final diagnoses:  Acute UTI  Other acute gastritis without hemorrhage   The patient appears reasonably screened and/or stabilized for discharge and I doubt any other medical condition or other [place] requiring further screening, evaluation, or treatment in the ED at this time. I  have discussed the findings, Dx and Tx plan with the patient/family who expressed understanding and agree(s) with the plan. Discharge instructions discussed at length. The patient/family was given strict return precautions who verbalized understanding of the instructions. No further questions at time of discharge.  Disposition: Discharge  Condition: Good  ED Discharge Orders          Ordered    sucralfate (CARAFATE) 1 GM/10ML suspension  3 times daily with meals & bedtime        01/18/23 0720    cephALEXin (KEFLEX) 500 MG capsule  3 times daily        01/18/23 0720    ondansetron (ZOFRAN-ODT) 4  MG disintegrating tablet  Every 8 hours PRN        01/18/23 0720              Follow Up: Primary care provider  Call  to schedule an appointment for close follow up           This chart was dictated using voice recognition software.  Despite best efforts to proofread,  errors can occur which can change the documentation meaning.    Fatima Blank, MD 01/18/23 [phone]
